# Patient Record
Sex: Female | Born: 1935 | Race: Black or African American | Hispanic: No
Health system: Southern US, Community
[De-identification: ages and names within clinical notes are randomized; demographics above are authoritative.]

## PROBLEM LIST (undated history)

## (undated) DIAGNOSIS — K219 Gastro-esophageal reflux disease without esophagitis: Secondary | ICD-10-CM

## (undated) DIAGNOSIS — F419 Anxiety disorder, unspecified: Secondary | ICD-10-CM

## (undated) DIAGNOSIS — F0390 Unspecified dementia without behavioral disturbance: Secondary | ICD-10-CM

## (undated) DIAGNOSIS — M1990 Unspecified osteoarthritis, unspecified site: Secondary | ICD-10-CM

## (undated) DIAGNOSIS — H409 Unspecified glaucoma: Secondary | ICD-10-CM

## (undated) DIAGNOSIS — F0280 Dementia in other diseases classified elsewhere without behavioral disturbance: Secondary | ICD-10-CM

## (undated) DIAGNOSIS — G309 Alzheimer's disease, unspecified: Secondary | ICD-10-CM

## (undated) DIAGNOSIS — K5900 Constipation, unspecified: Secondary | ICD-10-CM

## (undated) DIAGNOSIS — E7800 Pure hypercholesterolemia, unspecified: Secondary | ICD-10-CM

## (undated) DIAGNOSIS — J189 Pneumonia, unspecified organism: Secondary | ICD-10-CM

## (undated) DIAGNOSIS — I10 Essential (primary) hypertension: Secondary | ICD-10-CM

## (undated) DIAGNOSIS — E559 Vitamin D deficiency, unspecified: Secondary | ICD-10-CM

---

## 1998-02-03 ENCOUNTER — Ambulatory Visit (HOSPITAL_COMMUNITY): Admission: RE | Admit: 1998-02-03 | Discharge: 1998-02-03 | Payer: Self-pay | Admitting: *Deleted

## 1998-11-17 ENCOUNTER — Ambulatory Visit (HOSPITAL_COMMUNITY): Admission: RE | Admit: 1998-11-17 | Discharge: 1998-11-17 | Payer: Self-pay | Admitting: *Deleted

## 1999-03-28 ENCOUNTER — Encounter: Payer: Self-pay | Admitting: *Deleted

## 1999-03-28 ENCOUNTER — Ambulatory Visit (HOSPITAL_COMMUNITY): Admission: RE | Admit: 1999-03-28 | Discharge: 1999-03-28 | Payer: Self-pay | Admitting: *Deleted

## 2002-04-22 ENCOUNTER — Ambulatory Visit (HOSPITAL_COMMUNITY): Admission: RE | Admit: 2002-04-22 | Discharge: 2002-04-22 | Payer: Self-pay | Admitting: *Deleted

## 2002-04-22 ENCOUNTER — Encounter: Payer: Self-pay | Admitting: *Deleted

## 2004-03-27 ENCOUNTER — Ambulatory Visit (HOSPITAL_COMMUNITY): Admission: RE | Admit: 2004-03-27 | Discharge: 2004-03-27 | Payer: Self-pay | Admitting: *Deleted

## 2004-05-05 ENCOUNTER — Encounter: Admission: RE | Admit: 2004-05-05 | Discharge: 2004-05-05 | Payer: Self-pay | Admitting: *Deleted

## 2004-10-22 ENCOUNTER — Emergency Department (HOSPITAL_COMMUNITY): Admission: EM | Admit: 2004-10-22 | Discharge: 2004-10-22 | Payer: Self-pay | Admitting: Emergency Medicine

## 2004-10-27 ENCOUNTER — Emergency Department (HOSPITAL_COMMUNITY): Admission: EM | Admit: 2004-10-27 | Discharge: 2004-10-27 | Payer: Self-pay | Admitting: Emergency Medicine

## 2006-01-31 ENCOUNTER — Ambulatory Visit: Payer: Self-pay | Admitting: Hematology & Oncology

## 2006-08-18 ENCOUNTER — Emergency Department (HOSPITAL_COMMUNITY): Admission: EM | Admit: 2006-08-18 | Discharge: 2006-08-18 | Payer: Self-pay | Admitting: Family Medicine

## 2007-03-17 ENCOUNTER — Ambulatory Visit: Payer: Self-pay | Admitting: Hematology & Oncology

## 2008-09-10 ENCOUNTER — Ambulatory Visit: Payer: Self-pay | Admitting: Hematology and Oncology

## 2009-04-19 ENCOUNTER — Emergency Department (HOSPITAL_COMMUNITY): Admission: EM | Admit: 2009-04-19 | Discharge: 2009-04-19 | Payer: Self-pay | Admitting: Emergency Medicine

## 2010-05-04 ENCOUNTER — Inpatient Hospital Stay (HOSPITAL_COMMUNITY): Admission: EM | Admit: 2010-05-04 | Discharge: 2010-05-11 | Payer: Self-pay | Admitting: Emergency Medicine

## 2010-05-11 ENCOUNTER — Ambulatory Visit: Payer: Self-pay | Admitting: Psychiatry

## 2010-05-26 ENCOUNTER — Ambulatory Visit: Payer: Self-pay | Admitting: Psychiatry

## 2010-07-04 ENCOUNTER — Ambulatory Visit: Payer: Self-pay | Admitting: Psychiatry

## 2011-01-01 ENCOUNTER — Encounter: Payer: Self-pay | Admitting: Internal Medicine

## 2011-02-16 ENCOUNTER — Emergency Department (HOSPITAL_BASED_OUTPATIENT_CLINIC_OR_DEPARTMENT_OTHER)
Admission: EM | Admit: 2011-02-16 | Discharge: 2011-02-16 | Disposition: A | Discharge status: Home / Self Care | Payer: Medicare Other | Attending: Emergency Medicine | Admitting: Emergency Medicine

## 2011-02-16 DIAGNOSIS — I2510 Atherosclerotic heart disease of native coronary artery without angina pectoris: Secondary | ICD-10-CM | POA: Insufficient documentation

## 2011-02-16 DIAGNOSIS — Z79899 Other long term (current) drug therapy: Secondary | ICD-10-CM | POA: Insufficient documentation

## 2011-02-16 DIAGNOSIS — I10 Essential (primary) hypertension: Secondary | ICD-10-CM | POA: Insufficient documentation

## 2011-02-16 DIAGNOSIS — N390 Urinary tract infection, site not specified: Secondary | ICD-10-CM | POA: Insufficient documentation

## 2011-02-16 DIAGNOSIS — Z8739 Personal history of other diseases of the musculoskeletal system and connective tissue: Secondary | ICD-10-CM | POA: Insufficient documentation

## 2011-02-16 DIAGNOSIS — R350 Frequency of micturition: Secondary | ICD-10-CM | POA: Insufficient documentation

## 2011-02-16 LAB — URINALYSIS, ROUTINE W REFLEX MICROSCOPIC
Ketones, ur: 15 mg/dL — AB
Nitrite: POSITIVE — AB
Protein, ur: 100 mg/dL — AB
Specific Gravity, Urine: 1.018 (ref 1.005–1.030)
Urobilinogen, UA: 1 mg/dL (ref 0.0–1.0)

## 2011-02-16 LAB — URINE MICROSCOPIC-ADD ON

## 2011-02-19 LAB — URINE CULTURE
Colony Count: >=100000
Culture  Setup Time: 201203100143

## 2011-02-26 LAB — BASIC METABOLIC PANEL
BUN: 26 mg/dL — ABNORMAL HIGH (ref 6–23)
CO2: 26 mEq/L (ref 19–32)
Chloride: 107 mEq/L (ref 96–112)
Creatinine, Ser: 0.72 mg/dL (ref 0.4–1.2)
Potassium: 3.7 mEq/L (ref 3.5–5.1)

## 2011-02-26 LAB — DIFFERENTIAL
Basophils Absolute: 0 10*3/uL (ref 0.0–0.1)
Basophils Absolute: 0 10*3/uL (ref 0.0–0.1)
Eosinophils Relative: 1 % (ref 0–5)
Eosinophils Relative: 1 % (ref 0–5)
Lymphocytes Relative: 35 % (ref 12–46)
Lymphs Abs: 1.9 10*3/uL (ref 0.7–4.0)
Monocytes Absolute: 0.6 10*3/uL (ref 0.1–1.0)
Monocytes Relative: 12 % (ref 3–12)
Monocytes Relative: 17 % — ABNORMAL HIGH (ref 3–12)
Neutro Abs: 2 10*3/uL (ref 1.7–7.7)
Neutro Abs: 2.3 10*3/uL (ref 1.7–7.7)

## 2011-02-26 LAB — CARDIAC PANEL(CRET KIN+CKTOT+MB+TROPI)
CK, MB: 2.4 ng/mL (ref 0.3–4.0)
CK, MB: 4.2 ng/mL — ABNORMAL HIGH (ref 0.3–4.0)
Relative Index: 2.8 — ABNORMAL HIGH (ref 0.0–2.5)
Total CK: 121 U/L (ref 7–177)
Total CK: 151 U/L (ref 7–177)
Troponin I: 0.03 ng/mL (ref 0.00–0.06)

## 2011-02-26 LAB — URINE CULTURE: Colony Count: >=100000

## 2011-02-26 LAB — CBC
HCT: 27.8 % — ABNORMAL LOW (ref 36.0–46.0)
MCHC: 31.1 g/dL (ref 30.0–36.0)
MCHC: 31.9 g/dL (ref 30.0–36.0)
MCV: 79.5 fL (ref 78.0–100.0)
MCV: 80.2 fL (ref 78.0–100.0)
Platelets: 459 10*3/uL — ABNORMAL HIGH (ref 150–400)
Platelets: 519 10*3/uL — ABNORMAL HIGH (ref 150–400)
RDW: 21.9 % — ABNORMAL HIGH (ref 11.5–15.5)
WBC: 4.9 10*3/uL (ref 4.0–10.5)

## 2011-02-26 LAB — URINE MICROSCOPIC-ADD ON

## 2011-02-26 LAB — COMPREHENSIVE METABOLIC PANEL
AST: 20 U/L (ref 0–37)
Albumin: 3 g/dL — ABNORMAL LOW (ref 3.5–5.2)
CO2: 26 mEq/L (ref 19–32)
Calcium: 9 mg/dL (ref 8.4–10.5)
Creatinine, Ser: 0.67 mg/dL (ref 0.4–1.2)
GFR calc Af Amer: >60 mL/min (ref >60)
GFR calc non Af Amer: >60 mL/min (ref >60)
Sodium: 139 mEq/L (ref 135–145)
Total Protein: 7.4 g/dL (ref 6.0–8.3)

## 2011-02-26 LAB — PROTIME-INR: Prothrombin Time: 13.5 seconds (ref 11.6–15.2)

## 2011-02-26 LAB — IRON AND TIBC
Saturation Ratios: 10 % — ABNORMAL LOW (ref 20–55)
UIBC: 281 ug/dL

## 2011-02-26 LAB — RAPID URINE DRUG SCREEN, HOSP PERFORMED
Amphetamines: NOT DETECTED
Tetrahydrocannabinol: NOT DETECTED

## 2011-02-26 LAB — TRICYCLICS SCREEN, URINE: TCA Scrn: NOT DETECTED

## 2011-02-26 LAB — VITAMIN B12: Vitamin B-12: 325 pg/mL (ref 211–911)

## 2011-02-26 LAB — HEMOCCULT GUIAC POC 1CARD (OFFICE): Fecal Occult Bld: NEGATIVE

## 2011-02-26 LAB — URINALYSIS, ROUTINE W REFLEX MICROSCOPIC
Bilirubin Urine: NEGATIVE
Glucose, UA: NEGATIVE mg/dL
Ketones, ur: NEGATIVE mg/dL
Protein, ur: NEGATIVE mg/dL
pH: 7 (ref 5.0–8.0)

## 2011-02-26 LAB — FERRITIN: Ferritin: 57 ng/mL (ref 10–291)

## 2011-02-26 LAB — RPR: RPR Ser Ql: NONREACTIVE

## 2011-03-20 LAB — DIFFERENTIAL
Basophils Absolute: 0 10*3/uL (ref 0.0–0.1)
Basophils Relative: 0 % (ref 0–1)
Lymphocytes Relative: 20 % (ref 12–46)
Monocytes Relative: 13 % — ABNORMAL HIGH (ref 3–12)
Neutro Abs: 3.7 10*3/uL (ref 1.7–7.7)
Neutrophils Relative %: 66 % (ref 43–77)

## 2011-03-20 LAB — CBC
MCHC: 32.7 g/dL (ref 30.0–36.0)
RBC: 2.54 MIL/uL — ABNORMAL LOW (ref 3.87–5.11)

## 2011-03-20 LAB — HEMOCCULT GUIAC POC 1CARD (OFFICE): Fecal Occult Bld: POSITIVE

## 2013-06-07 ENCOUNTER — Emergency Department (HOSPITAL_BASED_OUTPATIENT_CLINIC_OR_DEPARTMENT_OTHER)
Admission: EM | Admit: 2013-06-07 | Discharge: 2013-06-08 | Disposition: A | Discharge status: Skilled Nursing Facility | Payer: Medicare Other | Attending: Emergency Medicine | Admitting: Emergency Medicine

## 2013-06-07 ENCOUNTER — Emergency Department (HOSPITAL_BASED_OUTPATIENT_CLINIC_OR_DEPARTMENT_OTHER): Payer: Medicare Other

## 2013-06-07 ENCOUNTER — Encounter (HOSPITAL_BASED_OUTPATIENT_CLINIC_OR_DEPARTMENT_OTHER): Payer: Self-pay | Admitting: *Deleted

## 2013-06-07 DIAGNOSIS — F0390 Unspecified dementia without behavioral disturbance: Secondary | ICD-10-CM | POA: Insufficient documentation

## 2013-06-07 DIAGNOSIS — K8689 Other specified diseases of pancreas: Secondary | ICD-10-CM | POA: Insufficient documentation

## 2013-06-07 DIAGNOSIS — Z79899 Other long term (current) drug therapy: Secondary | ICD-10-CM | POA: Insufficient documentation

## 2013-06-07 DIAGNOSIS — K869 Disease of pancreas, unspecified: Secondary | ICD-10-CM

## 2013-06-07 DIAGNOSIS — K219 Gastro-esophageal reflux disease without esophagitis: Secondary | ICD-10-CM | POA: Insufficient documentation

## 2013-06-07 DIAGNOSIS — R1110 Vomiting, unspecified: Secondary | ICD-10-CM

## 2013-06-07 DIAGNOSIS — K8020 Calculus of gallbladder without cholecystitis without obstruction: Secondary | ICD-10-CM | POA: Insufficient documentation

## 2013-06-07 DIAGNOSIS — N289 Disorder of kidney and ureter, unspecified: Secondary | ICD-10-CM | POA: Insufficient documentation

## 2013-06-07 DIAGNOSIS — E7800 Pure hypercholesterolemia, unspecified: Secondary | ICD-10-CM | POA: Insufficient documentation

## 2013-06-07 HISTORY — DX: Unspecified dementia, unspecified severity, without behavioral disturbance, psychotic disturbance, mood disturbance, and anxiety: F03.90

## 2013-06-07 HISTORY — DX: Pure hypercholesterolemia, unspecified: E78.00

## 2013-06-07 HISTORY — DX: Gastro-esophageal reflux disease without esophagitis: K21.9

## 2013-06-07 LAB — COMPREHENSIVE METABOLIC PANEL
ALT: 11 U/L (ref 0–35)
AST: 19 U/L (ref 0–37)
CO2: 25 mEq/L (ref 19–32)
Chloride: 98 mEq/L (ref 96–112)
GFR calc non Af Amer: 54 mL/min — ABNORMAL LOW (ref >90)
Potassium: 4 mEq/L (ref 3.5–5.1)
Sodium: 132 mEq/L — ABNORMAL LOW (ref 135–145)
Total Bilirubin: 0.2 mg/dL — ABNORMAL LOW (ref 0.3–1.2)

## 2013-06-07 LAB — CBC WITH DIFFERENTIAL/PLATELET
Basophils Absolute: 0 10*3/uL (ref 0.0–0.1)
HCT: 29 % — ABNORMAL LOW (ref 36.0–46.0)
Lymphocytes Relative: 19 % (ref 12–46)
Neutro Abs: 4.4 10*3/uL (ref 1.7–7.7)
Platelets: 416 10*3/uL — ABNORMAL HIGH (ref 150–400)
RDW: 19 % — ABNORMAL HIGH (ref 11.5–15.5)
WBC: 5.9 10*3/uL (ref 4.0–10.5)

## 2013-06-07 LAB — URINALYSIS, ROUTINE W REFLEX MICROSCOPIC
Glucose, UA: NEGATIVE mg/dL
Hgb urine dipstick: NEGATIVE
Ketones, ur: NEGATIVE mg/dL
Protein, ur: NEGATIVE mg/dL

## 2013-06-07 MED ORDER — IOHEXOL 300 MG/ML  SOLN: 50.0000 mL | Freq: Once | INTRAMUSCULAR | Status: AC | PRN

## 2013-06-07 MED ORDER — IOHEXOL 300 MG/ML  SOLN
100.0000 mL | Freq: Once | INTRAMUSCULAR | Status: AC | PRN
  Administered 2013-06-07: 100 mL via INTRAVENOUS

## 2013-06-07 NOTE — ED Notes (Signed)
I assisted with socks, then helped patient on to a bedside toilet. After helping her back into bed, I placed new diaper on her, then re-covered her made comfortable. I then took urine sample to lab.

## 2013-06-07 NOTE — ED Notes (Signed)
Per EMS patient had sudden onset of vomiting, no meds given. Per EMS glucose -  122, no c/o tenderness

## 2013-06-07 NOTE — ED Provider Notes (Signed)
History    CSN: [id number] Arrival date & time 06/07/13  1826  First MD Initiated Contact with Patient 06/07/13 1903     Chief Complaint  Patient presents with  . Emesis   (Consider location/radiation/quality/duration/timing/severity/associated sxs/prior Treatment) HPI Karla Morgan is a 79 y.o. female who presents to ED with from nursing home with complaint of vomiting x1. Per nursing home, pt was brought in to dining room to eat when she vomited x1. Pt denies any pain or any complaints. Pt does have advance dementia and not a very good  Historian. No other history provided Past Medical History  Diagnosis Date  . Dementia   . Acid reflux   . High cholesterol    History reviewed. No pertinent past surgical history. No family history on file. History  Substance Use Topics  . Smoking status: Not on file  . Smokeless tobacco: Not on file  . Alcohol Use: No   OB History   Grav Para Term Preterm Abortions TAB SAB Ect Mult Living                 Review of Systems  Unable to perform ROS: Dementia  Constitutional: Negative for fever.  Gastrointestinal: Positive for vomiting.    Allergies  Review of patient's allergies indicates no known allergies.  Home Medications   Current Outpatient Rx  Name  Route  Sig  Dispense  Refill  . acetaminophen (TYLENOL) 325 MG tablet   Oral   Take 650 mg by mouth every 6 (six) hours as needed for pain.         . cholecalciferol (VITAMIN D) 1000 UNITS tablet   Oral   Take 1,000 Units by mouth daily.         . ferrous sulfate 325 (65 FE) MG tablet   Oral   Take 325 mg by mouth daily with breakfast.         . fluticasone (FLONASE) 50 MCG/ACT nasal spray   Nasal   Place 2 sprays into the nose 2 (two) times daily.         Marland Kitchen omeprazole (PRILOSEC) 20 MG capsule   Oral   Take 20 mg by mouth daily.         . polyethylene glycol (MIRALAX / GLYCOLAX) packet   Oral   Take 17 g by mouth daily as needed.         .  simvastatin (ZOCOR) 5 MG tablet   Oral   Take 5 mg by mouth at bedtime.         . Travoprost, BAK Free, (TRAVATAN) 0.004 % SOLN ophthalmic solution   Both Eyes   Place 1 drop into both eyes at bedtime.          BP 172/69  Pulse 76  Temp(Src) 97.5 F (36.4 C) (Oral)  Resp 19  SpO2 100% Physical Exam  Nursing note and vitals reviewed. Constitutional: She appears well-developed and well-nourished. No distress.  Cardiovascular: Normal rate, regular rhythm and normal heart sounds.   Pulmonary/Chest: Effort normal and breath sounds normal. No respiratory distress. She has no wheezes. She has no rales.  Abdominal: Soft. Bowel sounds are normal. She exhibits no distension. There is tenderness. There is no rebound and no guarding.  Diffuse tenderness worse in LLQ  Musculoskeletal: She exhibits no edema.  Neurological: She is alert.  Pt disoriented  Skin: Skin is warm and dry.    ED Course  Procedures (including critical care time) Labs Reviewed  CBC WITH DIFFERENTIAL - Abnormal; Notable for the following:    RBC 3.47 (*)    Hemoglobin 9.1 (*)    HCT 29.0 (*)    RDW 19.0 (*)    Platelets 416 (*)    All other components within normal limits  COMPREHENSIVE METABOLIC PANEL - Abnormal; Notable for the following:    Sodium 132 (*)    Glucose, Bld 115 (*)    Total Protein 10.3 (*)    Albumin 3.4 (*)    Total Bilirubin 0.2 (*)    GFR calc non Af Amer 54 (*)    GFR calc Af Amer 63 (*)    All other components within normal limits  LIPASE, BLOOD  URINALYSIS, ROUTINE W REFLEX MICROSCOPIC   Ct Abdomen Pelvis W Contrast  06/08/2013   *RADIOLOGY REPORT*  Clinical Data: Vomiting.  Dementia.  CT ABDOMEN AND PELVIS WITH CONTRAST  Technique:  Multidetector CT imaging of the abdomen and pelvis was performed following the standard protocol during bolus administration of intravenous contrast.  Contrast: OMNIPAQUE IOHEXOL 300 MG/ML  SOLN  Comparison: Chest and two views abdomen this same  date.  Findings: There is cardiomegaly.  No pleural or pericardial effusion.  Mild basilar atelectasis is identified.  Small stones are seen layering dependently within the gallbladder. There is no CT evidence of acute cholecystitis.  The liver, spleen, adrenal glands and biliary tree are unremarkable.  There is a 1.1 x 1.2 cm lesion in the right kidney which demonstrates increased attenuation and cannot be definitively characterized on this study. A simple left renal cyst is identified.  Low attenuating lesion in the body of the pancreas measuring 1.2 by 1.1 cm is identified. There is no pancreatic atrophy or ductal dilatation.  The patient is status post hysterectomy.  Colonic diverticulosis without diverticulitis is identified.  The stomach and small bowel are unremarkable.  No lytic or sclerotic bony lesion is seen with lumbar scoliosis and multilevel spondylosis identified.  The patient has mild superior endplate compression fractures of T12 and L1 which appear remote but cannot be definitively characterized.  No focal lytic or sclerotic bony lesion is identified.  IMPRESSION:  1.  No acute abnormality.  2.  Small gallstones without evidence of cholecystitis.  3.  Diverticulosis without diverticulitis.  4.  Nonspecific lesion in the right kidney cannot be definitively characterized.  Recommend repeat CT scan in 6-9 months indicated to exclude renal cell neoplasm.  5.  1.2 cm low attenuating pancreatic lesion is likely due to prior inflammatory process.  6.  Mild superior endplate compression fractures of T12 and L1 cannot be definitively characterized but appear remote.   Original Report Authenticated By: Holley Dexter, M.D.   Dg Abd Acute W/chest  06/07/2013   *RADIOLOGY REPORT*  Clinical Data:  Sudden onset of vomiting  ACUTE ABDOMEN SERIES (ABDOMEN 2 VIEW & CHEST 1 VIEW)  Comparison: Prior radiograph from 05/04/10  Findings: The cardiac and mediastinal silhouettes are stable in size and contour.  Mild  tortuosity of the thoracic aorta is noted. Calcified atherosclerotic disease is present within the aortic arch.  The current examination has been performed with a slightly shallow degree of lung inflation.  There is no airspace consolidation, pleural effusion, or pulmonary edema.  Mild linear opacities present at the bases most likely reflect atelectasis.  There is no pneumothorax.  In the abdomen, no dilated loops of bowel to suggest obstruction or ileus are identified.  There is no free intraperitoneal air.  No  abnormal soft tissue masses or calcifications are identified. Multiple calcified phleboliths are noted.  Scoliosis of the thoracolumbar spine is again noted, unchanged. There is diffuse osteopenia.  No acute osseous abnormalities.  IMPRESSION: 1.  Stable appearance of the chest with no acute cardiopulmonary process. Mild bibasilar atelectasis.  2.  Nonobstructive bowel gas pattern.   Original Report Authenticated By: Rise Mu, M.D.   1. Vomiting   2. Cholelithiasis   3. Pancreatic lesion   4. Kidney lesion, native, right     MDM  Pt with episode of vomiting  Prior to dinner today. On examination pt did have abdominal tenderness. No guarding noted. Bowel sounds normal. Labs and UA unremarkable. Pt refusing PO challenge, un cooperative. Unable to further assess. CT abd/pelvis ordered. CT unremarkable. Pt denies headache, no cp, no shortness of breath. Hypertensive, otherwise normal VS. Pt not vomiting in ED. Plan to d/c home with close follow up with PCP.   Filed Vitals:   06/07/13 1830  BP: 172/69  Pulse: 76  Temp: 97.5 F (36.4 C)  TempSrc: Oral  Resp: 19  SpO2: 100%     Myriam Jacobson Raydon Chappuis, PA-C 06/08/13 0024

## 2013-06-08 NOTE — ED Notes (Signed)
[facility] called to transport back to [facility]

## 2013-06-08 NOTE — ED Notes (Signed)
Two attempts to call [facility] without answer from staff to give report on returning patient.

## 2013-06-09 NOTE — ED Provider Notes (Signed)
Medical screening examination/treatment/procedure(s) were conducted as a shared visit with non-physician practitioner(s) and myself.  I personally evaluated the patient during the encounter   Rolan Bucco, MD 06/09/13 1458

## 2014-07-03 ENCOUNTER — Encounter (HOSPITAL_BASED_OUTPATIENT_CLINIC_OR_DEPARTMENT_OTHER): Payer: Self-pay | Admitting: Emergency Medicine

## 2014-07-03 ENCOUNTER — Emergency Department (HOSPITAL_BASED_OUTPATIENT_CLINIC_OR_DEPARTMENT_OTHER): Payer: Medicare Other

## 2014-07-03 ENCOUNTER — Emergency Department (HOSPITAL_BASED_OUTPATIENT_CLINIC_OR_DEPARTMENT_OTHER)
Admission: EM | Admit: 2014-07-03 | Discharge: 2014-07-03 | Disposition: A | Discharge status: Skilled Nursing Facility | Payer: Medicare Other | Attending: Emergency Medicine | Admitting: Emergency Medicine

## 2014-07-03 DIAGNOSIS — H409 Unspecified glaucoma: Secondary | ICD-10-CM | POA: Diagnosis not present

## 2014-07-03 DIAGNOSIS — Z79899 Other long term (current) drug therapy: Secondary | ICD-10-CM | POA: Insufficient documentation

## 2014-07-03 DIAGNOSIS — E7800 Pure hypercholesterolemia, unspecified: Secondary | ICD-10-CM | POA: Insufficient documentation

## 2014-07-03 DIAGNOSIS — Z8701 Personal history of pneumonia (recurrent): Secondary | ICD-10-CM | POA: Diagnosis not present

## 2014-07-03 DIAGNOSIS — M129 Arthropathy, unspecified: Secondary | ICD-10-CM | POA: Diagnosis not present

## 2014-07-03 DIAGNOSIS — I10 Essential (primary) hypertension: Secondary | ICD-10-CM | POA: Diagnosis not present

## 2014-07-03 DIAGNOSIS — K219 Gastro-esophageal reflux disease without esophagitis: Secondary | ICD-10-CM | POA: Diagnosis not present

## 2014-07-03 DIAGNOSIS — F0280 Dementia in other diseases classified elsewhere without behavioral disturbance: Secondary | ICD-10-CM | POA: Insufficient documentation

## 2014-07-03 DIAGNOSIS — Z8659 Personal history of other mental and behavioral disorders: Secondary | ICD-10-CM | POA: Insufficient documentation

## 2014-07-03 DIAGNOSIS — G309 Alzheimer's disease, unspecified: Secondary | ICD-10-CM | POA: Insufficient documentation

## 2014-07-03 DIAGNOSIS — IMO0002 Reserved for concepts with insufficient information to code with codable children: Secondary | ICD-10-CM | POA: Diagnosis not present

## 2014-07-03 DIAGNOSIS — R109 Unspecified abdominal pain: Secondary | ICD-10-CM | POA: Insufficient documentation

## 2014-07-03 HISTORY — DX: Unspecified glaucoma: H40.9

## 2014-07-03 HISTORY — DX: Anxiety disorder, unspecified: F41.9

## 2014-07-03 HISTORY — DX: Vitamin D deficiency, unspecified: E55.9

## 2014-07-03 HISTORY — DX: Dementia in other diseases classified elsewhere, unspecified severity, without behavioral disturbance, psychotic disturbance, mood disturbance, and anxiety: F02.80

## 2014-07-03 HISTORY — DX: Essential (primary) hypertension: I10

## 2014-07-03 HISTORY — DX: Constipation, unspecified: K59.00

## 2014-07-03 HISTORY — DX: Alzheimer's disease, unspecified: G30.9

## 2014-07-03 HISTORY — DX: Pneumonia, unspecified organism: J18.9

## 2014-07-03 HISTORY — DX: Unspecified osteoarthritis, unspecified site: M19.90

## 2014-07-03 LAB — COMPREHENSIVE METABOLIC PANEL
ALK PHOS: 70 U/L (ref 39–117)
ALT: 13 U/L (ref 0–35)
ANION GAP: 8 (ref 5–15)
AST: 24 U/L (ref 0–37)
Albumin: 3.2 g/dL — ABNORMAL LOW (ref 3.5–5.2)
BILIRUBIN TOTAL: 0.4 mg/dL (ref 0.3–1.2)
BUN: 20 mg/dL (ref 6–23)
CHLORIDE: 102 meq/L (ref 96–112)
CO2: 25 mEq/L (ref 19–32)
CREATININE: 0.9 mg/dL (ref 0.50–1.10)
Calcium: 10.1 mg/dL (ref 8.4–10.5)
GFR calc non Af Amer: 54 mL/min — ABNORMAL LOW (ref >90)
GFR, EST AFRICAN AMERICAN: 62 mL/min — AB (ref >90)
Glucose, Bld: 91 mg/dL (ref 70–99)
Potassium: 4.6 mEq/L (ref 3.7–5.3)
Sodium: 135 mEq/L — ABNORMAL LOW (ref 137–147)
TOTAL PROTEIN: 11.1 g/dL — AB (ref 6.0–8.3)

## 2014-07-03 LAB — URINALYSIS, ROUTINE W REFLEX MICROSCOPIC
Bilirubin Urine: NEGATIVE
Glucose, UA: NEGATIVE mg/dL
Hgb urine dipstick: NEGATIVE
Ketones, ur: NEGATIVE mg/dL
Leukocytes, UA: NEGATIVE
Nitrite: NEGATIVE
Protein, ur: 30 mg/dL — AB
Specific Gravity, Urine: 1.019 (ref 1.005–1.030)
Urobilinogen, UA: 0.2 mg/dL (ref 0.0–1.0)
pH: 8 (ref 5.0–8.0)

## 2014-07-03 LAB — CBC WITH DIFFERENTIAL/PLATELET
Basophils Absolute: 0 K/uL (ref 0.0–0.1)
Basophils Relative: 0 % (ref 0–1)
Eosinophils Absolute: 0 K/uL (ref 0.0–0.7)
Eosinophils Relative: 0 % (ref 0–5)
HCT: 25 % — ABNORMAL LOW (ref 36.0–46.0)
Hemoglobin: 7.6 g/dL — ABNORMAL LOW (ref 12.0–15.0)
Lymphocytes Relative: 37 % (ref 12–46)
Lymphs Abs: 1.5 K/uL (ref 0.7–4.0)
MCH: 26.5 pg (ref 26.0–34.0)
MCHC: 30.4 g/dL (ref 30.0–36.0)
MCV: 87.1 fL (ref 78.0–100.0)
Monocytes Absolute: 0.3 K/uL (ref 0.1–1.0)
Monocytes Relative: 8 % (ref 3–12)
Neutro Abs: 2.1 K/uL (ref 1.7–7.7)
Neutrophils Relative %: 54 % (ref 43–77)
Platelets: 388 K/uL (ref 150–400)
RBC: 2.87 MIL/uL — ABNORMAL LOW (ref 3.87–5.11)
RDW: 19.4 % — ABNORMAL HIGH (ref 11.5–15.5)
WBC: 3.9 K/uL — ABNORMAL LOW (ref 4.0–10.5)

## 2014-07-03 LAB — URINE MICROSCOPIC-ADD ON

## 2014-07-03 NOTE — ED Provider Notes (Signed)
CSN: [id number]     Arrival date & time 07/03/14  1727 History  This chart was scribed for Geoffery Lyonsouglas Florenda Watt, MD by Ardelia Memsylan Malpass, ED Scribe. This patient was seen in room MH01/MH01 and the patient's care was started at 5:43 PM.   Chief Complaint  Patient presents with  . Flank Pain    The history is provided by the patient. No language interpreter was used.    HPI Comments: Karla Morgan is a 80 y.o. female with a history of dementia who was transported from [facility] facility to the Emergency Department  complaining of intermittent generalized pain in her left side for the past week. She indicates that the pain is present in her left flank and over her left lateral rib area. Staff at [facility] were concerned that pt may be constipated, and she has a history of the same. She denies any difficulty urinating or producing stools. She states that her last BM was yesterday. Pt denies any recent falls or injuries.    Past Medical History  Diagnosis Date  . Dementia   . Acid reflux   . High cholesterol   . Hypertension   . Arthritis   . Anxiety   . Glaucoma   . Constipation   . Vitamin D deficiency   . CAP (community acquired pneumonia)   . Alzheimer's dementia    No past surgical history on file. No family history on file. History  Substance Use Topics  . Smoking status: Not on file  . Smokeless tobacco: Not on file  . Alcohol Use: No   OB History   Grav Para Term Preterm Abortions TAB SAB Ect Mult Living                 Review of Systems A complete 10 system review of systems was obtained and all systems are negative except as noted in the HPI and PMH.   Allergies  Review of patient's allergies indicates no known allergies.  Home Medications   Prior to Admission medications   Medication Sig Start Date End Date Taking? Authorizing Provider  acetaminophen (TYLENOL) 325 MG tablet Take 650 mg by mouth every 6 (six) hours as needed for pain.    Historical Provider, MD   cholecalciferol (VITAMIN D) 1000 UNITS tablet Take 1,000 Units by mouth daily.    Historical Provider, MD  ferrous sulfate 325 (65 FE) MG tablet Take 325 mg by mouth daily with breakfast.    Historical Provider, MD  fluticasone (FLONASE) 50 MCG/ACT nasal spray Place 2 sprays into the nose 2 (two) times daily.    Historical Provider, MD  omeprazole (PRILOSEC) 20 MG capsule Take 20 mg by mouth daily.    Historical Provider, MD  polyethylene glycol (MIRALAX / GLYCOLAX) packet Take 17 g by mouth daily as needed.    Historical Provider, MD  simvastatin (ZOCOR) 5 MG tablet Take 5 mg by mouth at bedtime.    Historical Provider, MD  Travoprost, BAK Free, (TRAVATAN) 0.004 % SOLN ophthalmic solution Place 1 drop into both eyes at bedtime.    Historical Provider, MD   BP 134/75  Pulse 91  Temp(Src) 98.4 F (36.9 C) (Oral)  Resp 20  Physical Exam  Nursing note and vitals reviewed. Constitutional: She is oriented to person, place, and time. She appears well-developed and well-nourished. No distress.  HENT:  Head: Normocephalic and atraumatic.  Eyes: Conjunctivae and EOM are normal.  Neck: Neck supple. No tracheal deviation present.  Cardiovascular: Normal rate.  Pulmonary/Chest: Effort normal. No respiratory distress.  Abdominal: Soft. Bowel sounds are normal. She exhibits no distension. There is no tenderness.  There is no CVA tenderness  Musculoskeletal: Normal range of motion.  Neurological: She is alert and oriented to person, place, and time.  Skin: Skin is warm and dry.  Psychiatric: She has a normal mood and affect. Her behavior is normal.    ED Course  Procedures (including critical care time)  DIAGNOSTIC STUDIES:   COORDINATION OF CARE: 5:49 PM- Pt advised of plan for treatment and pt agrees.  Labs Review Labs Reviewed  CBC WITH DIFFERENTIAL  COMPREHENSIVE METABOLIC PANEL  URINALYSIS, ROUTINE W REFLEX MICROSCOPIC    Imaging Review No results found.   EKG  Interpretation None      MDM   Final diagnoses:  None    Patient sent here for evaluation of left flank pain. She is a resident of an extended care facility and was sent here to be evaluated for possible constipation. Patient appears to be somewhat demented, however is able to answer my questions appropriately.She is denying to me she is having any abdominal pain. When asked where her pain is located, she points to her left lateral rib cage. She is tender to palpation over this area, however x-rays do not reveal any fractures. A KUB was obtained which reveals a nonspecific bowel gas pattern, and does not appear to have a fecal impaction or significant quantity of stool or air. Laboratory studies revealed no elevation of white count and there is no fever that would indicate an infectious process. She is anemic with a hemoglobin of 7.8, however upon reviewing her chart this is consistent with her baseline. There is no significant electrolyte abnormality and urinalysis does not reveal an infection. At this point I feel as though she is appropriate for discharge with when necessary followup.   I personally performed the services described in this documentation, which was scribed in my presence. The recorded information has been reviewed and is accurate.     Geoffery Lyons, MD 07/03/14 2003

## 2014-07-03 NOTE — Discharge Instructions (Signed)
Flank Pain °Flank pain refers to pain that is located on the side of the body between the upper abdomen and the back. The pain may occur over a short period of time (acute) or may be long-term or reoccurring (chronic). It may be mild or severe. Flank pain can be caused by many things. °CAUSES  °Some of the more common causes of flank pain include: °· Muscle strains.   °· Muscle spasms.   °· A disease of your spine (vertebral disk disease).   °· A lung infection (pneumonia).   °· Fluid around your lungs (pulmonary edema).   °· A kidney infection.   °· Kidney stones.   °· A very painful skin rash caused by the chickenpox virus (shingles).   °· Gallbladder disease.   °HOME CARE INSTRUCTIONS  °Home care will depend on the cause of your pain. In general, °· Rest as directed by your caregiver. °· Drink enough fluids to keep your urine clear or pale yellow. °· Only take over-the-counter or prescription medicines as directed by your caregiver. Some medicines may help relieve the pain. °· Tell your caregiver about any changes in your pain. °· Follow up with your caregiver as directed. °SEEK IMMEDIATE MEDICAL CARE IF:  °· Your pain is not controlled with medicine.   °· You have new or worsening symptoms. °· Your pain increases.   °· You have abdominal pain.   °· You have shortness of breath.   °· You have persistent nausea or vomiting.   °· You have swelling in your abdomen.   °· You feel faint or pass out.   °· You have blood in your urine. °· You have a fever or persistent symptoms for more than 2-3 days. °· You have a fever and your symptoms suddenly get worse. °MAKE SURE YOU:  °· Understand these instructions. °· Will watch your condition. °· Will get help right away if you are not doing well or get worse. °Document Released: 01/17/2006 Document Revised: 08/20/2012 Document Reviewed: 07/10/2012 °Exit[employer]® Patient Information ©2015 [employer]. This information is not intended to replace advice given to you by your  health care provider. Make sure you discuss any questions you have with your health care provider. ° °

## 2014-07-03 NOTE — ED Notes (Signed)
Per transport, [facility] facility  requested she be transported to ER for Left side pain over the past week. No n/v/d. Facility concerned she may be constipated. Patient states her L side is tender to touch

## 2014-07-28 ENCOUNTER — Emergency Department (HOSPITAL_BASED_OUTPATIENT_CLINIC_OR_DEPARTMENT_OTHER): Payer: Medicare Other

## 2014-07-28 ENCOUNTER — Inpatient Hospital Stay (HOSPITAL_BASED_OUTPATIENT_CLINIC_OR_DEPARTMENT_OTHER)
Admission: EM | Admit: 2014-07-28 | Discharge: 2014-08-06 | DRG: 682 | Disposition: A | Discharge status: Hospice | Payer: Medicare Other | Attending: Internal Medicine | Admitting: Internal Medicine

## 2014-07-28 ENCOUNTER — Encounter (HOSPITAL_BASED_OUTPATIENT_CLINIC_OR_DEPARTMENT_OTHER): Payer: Self-pay | Admitting: Emergency Medicine

## 2014-07-28 DIAGNOSIS — E871 Hypo-osmolality and hyponatremia: Secondary | ICD-10-CM | POA: Diagnosis present

## 2014-07-28 DIAGNOSIS — F0280 Dementia in other diseases classified elsewhere without behavioral disturbance: Secondary | ICD-10-CM | POA: Diagnosis present

## 2014-07-28 DIAGNOSIS — E860 Dehydration: Secondary | ICD-10-CM

## 2014-07-28 DIAGNOSIS — I10 Essential (primary) hypertension: Secondary | ICD-10-CM

## 2014-07-28 DIAGNOSIS — A0472 Enterocolitis due to Clostridium difficile, not specified as recurrent: Secondary | ICD-10-CM | POA: Diagnosis present

## 2014-07-28 DIAGNOSIS — N19 Unspecified kidney failure: Secondary | ICD-10-CM

## 2014-07-28 DIAGNOSIS — E43 Unspecified severe protein-calorie malnutrition: Secondary | ICD-10-CM | POA: Diagnosis present

## 2014-07-28 DIAGNOSIS — K219 Gastro-esophageal reflux disease without esophagitis: Secondary | ICD-10-CM | POA: Diagnosis present

## 2014-07-28 DIAGNOSIS — N179 Acute kidney failure, unspecified: Secondary | ICD-10-CM | POA: Diagnosis present

## 2014-07-28 DIAGNOSIS — D649 Anemia, unspecified: Secondary | ICD-10-CM | POA: Diagnosis present

## 2014-07-28 DIAGNOSIS — I959 Hypotension, unspecified: Secondary | ICD-10-CM | POA: Diagnosis present

## 2014-07-28 DIAGNOSIS — E7800 Pure hypercholesterolemia, unspecified: Secondary | ICD-10-CM | POA: Diagnosis present

## 2014-07-28 DIAGNOSIS — F411 Generalized anxiety disorder: Secondary | ICD-10-CM | POA: Diagnosis present

## 2014-07-28 DIAGNOSIS — R627 Adult failure to thrive: Secondary | ICD-10-CM | POA: Diagnosis present

## 2014-07-28 DIAGNOSIS — M129 Arthropathy, unspecified: Secondary | ICD-10-CM | POA: Diagnosis present

## 2014-07-28 DIAGNOSIS — Z66 Do not resuscitate: Secondary | ICD-10-CM | POA: Diagnosis not present

## 2014-07-28 DIAGNOSIS — E876 Hypokalemia: Secondary | ICD-10-CM | POA: Diagnosis present

## 2014-07-28 DIAGNOSIS — G309 Alzheimer's disease, unspecified: Secondary | ICD-10-CM | POA: Diagnosis present

## 2014-07-28 DIAGNOSIS — Z515 Encounter for palliative care: Secondary | ICD-10-CM | POA: Diagnosis not present

## 2014-07-28 DIAGNOSIS — H409 Unspecified glaucoma: Secondary | ICD-10-CM | POA: Diagnosis present

## 2014-07-28 DIAGNOSIS — E162 Hypoglycemia, unspecified: Secondary | ICD-10-CM | POA: Diagnosis present

## 2014-07-28 LAB — CBC WITH DIFFERENTIAL/PLATELET
BASOS PCT: 0 % (ref 0–1)
Basophils Absolute: 0 10*3/uL (ref 0.0–0.1)
EOS PCT: 1 % (ref 0–5)
Eosinophils Absolute: 0.1 10*3/uL (ref 0.0–0.7)
HEMATOCRIT: 29.3 % — AB (ref 36.0–46.0)
HEMOGLOBIN: 8.5 g/dL — AB (ref 12.0–15.0)
LYMPHS PCT: 42 % (ref 12–46)
Lymphs Abs: 2.1 10*3/uL (ref 0.7–4.0)
MCH: 26 pg (ref 26.0–34.0)
MCHC: 29 g/dL — ABNORMAL LOW (ref 30.0–36.0)
MCV: 89.6 fL (ref 78.0–100.0)
MONO ABS: 0.3 10*3/uL (ref 0.1–1.0)
Monocytes Relative: 6 % (ref 3–12)
NRBC: 2 /100{WBCs} — AB
Neutro Abs: 2.5 10*3/uL (ref 1.7–7.7)
Neutrophils Relative %: 51 % (ref 43–77)
Platelets: 337 10*3/uL (ref 150–400)
RBC: 3.27 MIL/uL — AB (ref 3.87–5.11)
RDW: 21.5 % — ABNORMAL HIGH (ref 11.5–15.5)
WBC: 5 10*3/uL (ref 4.0–10.5)

## 2014-07-28 LAB — COMPREHENSIVE METABOLIC PANEL
ALBUMIN: 3.5 g/dL (ref 3.5–5.2)
ALT: 21 U/L (ref 0–35)
AST: 35 U/L (ref 0–37)
Alkaline Phosphatase: 84 U/L (ref 39–117)
Anion gap: 12 (ref 5–15)
BUN: 96 mg/dL — ABNORMAL HIGH (ref 6–23)
CALCIUM: 10.7 mg/dL — AB (ref 8.4–10.5)
CO2: 25 mEq/L (ref 19–32)
Chloride: 123 mEq/L — ABNORMAL HIGH (ref 96–112)
Creatinine, Ser: 2.1 mg/dL — ABNORMAL HIGH (ref 0.50–1.10)
GFR calc Af Amer: 22 mL/min — ABNORMAL LOW (ref >90)
GFR calc non Af Amer: 19 mL/min — ABNORMAL LOW (ref >90)
Glucose, Bld: 106 mg/dL — ABNORMAL HIGH (ref 70–99)
POTASSIUM: 4 meq/L (ref 3.7–5.3)
SODIUM: 160 meq/L — AB (ref 137–147)
TOTAL PROTEIN: 12.6 g/dL — AB (ref 6.0–8.3)
Total Bilirubin: 0.5 mg/dL (ref 0.3–1.2)

## 2014-07-28 LAB — URINALYSIS, ROUTINE W REFLEX MICROSCOPIC
GLUCOSE, UA: NEGATIVE mg/dL
HGB URINE DIPSTICK: NEGATIVE
Ketones, ur: NEGATIVE mg/dL
LEUKOCYTES UA: NEGATIVE
Nitrite: NEGATIVE
PH: 5 (ref 5.0–8.0)
PROTEIN: 30 mg/dL — AB
Specific Gravity, Urine: 1.026 (ref 1.005–1.030)
Urobilinogen, UA: 0.2 mg/dL (ref 0.0–1.0)

## 2014-07-28 LAB — URINE MICROSCOPIC-ADD ON

## 2014-07-28 MED ORDER — PANTOPRAZOLE SODIUM 40 MG PO TBEC
40.0000 mg | DELAYED_RELEASE_TABLET | Freq: Every day | ORAL | Status: DC
  Administered 2014-08-02: 40 mg via ORAL
  Filled 2014-07-28 (×2): qty 1

## 2014-07-28 MED ORDER — HEPARIN SODIUM (PORCINE) 5000 UNIT/ML IJ SOLN
5000.0000 [IU] | Freq: Three times a day (TID) | INTRAMUSCULAR | Status: DC
Start: 2014-07-28 — End: 2014-08-03
  Administered 2014-07-29 – 2014-08-03 (×8): 5000 [IU] via SUBCUTANEOUS
  Filled 2014-07-28 (×22): qty 1

## 2014-07-28 MED ORDER — LATANOPROST 0.005 % OP SOLN
1.0000 [drp] | Freq: Every day | OPHTHALMIC | Status: DC
  Administered 2014-07-29 – 2014-08-04 (×4): 1 [drp] via OPHTHALMIC
  Filled 2014-07-28: qty 2.5

## 2014-07-28 MED ORDER — DOCUSATE SODIUM 100 MG PO CAPS
100.0000 mg | ORAL_CAPSULE | Freq: Two times a day (BID) | ORAL | Status: DC
  Administered 2014-07-29 – 2014-08-02 (×3): 100 mg via ORAL
  Filled 2014-07-28 (×4): qty 1

## 2014-07-28 MED ORDER — SODIUM CHLORIDE 0.9 % IV SOLN: INTRAVENOUS | Status: DC

## 2014-07-28 MED ORDER — BISACODYL 10 MG RE SUPP: 10.0000 mg | Freq: Every day | RECTAL | Status: DC | PRN

## 2014-07-28 MED ORDER — VITAMIN B-1 100 MG PO TABS
100.0000 mg | ORAL_TABLET | Freq: Every day | ORAL | Status: DC
  Administered 2014-08-02: 100 mg via ORAL
  Filled 2014-07-28 (×6): qty 1

## 2014-07-28 MED ORDER — ACETAMINOPHEN 325 MG PO TABS: 650.0000 mg | ORAL_TABLET | Freq: Four times a day (QID) | ORAL | Status: DC | PRN

## 2014-07-28 MED ORDER — OXYCODONE HCL 5 MG PO TABS
5.0000 mg | ORAL_TABLET | ORAL | Status: DC | PRN
  Administered 2014-07-30 – 2014-07-31 (×2): 5 mg via ORAL
  Filled 2014-07-28 (×2): qty 1

## 2014-07-28 MED ORDER — VITAMIN D3 25 MCG (1000 UNIT) PO TABS
1000.0000 [IU] | ORAL_TABLET | Freq: Every day | ORAL | Status: DC
  Filled 2014-07-28 (×6): qty 1

## 2014-07-28 MED ORDER — SODIUM CHLORIDE 0.9 % IV BOLUS (SEPSIS)
500.0000 mL | Freq: Once | INTRAVENOUS | Status: AC
  Administered 2014-07-28: 500 mL via INTRAVENOUS

## 2014-07-28 MED ORDER — ADULT MULTIVITAMIN W/MINERALS CH
1.0000 | ORAL_TABLET | Freq: Every day | ORAL | Status: DC
  Administered 2014-08-02: 1 via ORAL
  Filled 2014-07-28 (×6): qty 1

## 2014-07-28 MED ORDER — ONDANSETRON HCL 4 MG PO TABS: 4.0000 mg | ORAL_TABLET | Freq: Four times a day (QID) | ORAL | Status: DC | PRN

## 2014-07-28 MED ORDER — SODIUM CHLORIDE 0.9 % IJ SOLN
3.0000 mL | Freq: Two times a day (BID) | INTRAMUSCULAR | Status: DC
  Administered 2014-08-01 – 2014-08-02 (×2): 3 mL via INTRAVENOUS

## 2014-07-28 MED ORDER — SENNA 8.6 MG PO TABS: 1.0000 | ORAL_TABLET | Freq: Every day | ORAL | Status: DC | PRN

## 2014-07-28 MED ORDER — FERROUS SULFATE 325 (65 FE) MG PO TABS
325.0000 mg | ORAL_TABLET | Freq: Every day | ORAL | Status: DC
  Administered 2014-08-02: 325 mg via ORAL
  Filled 2014-07-28 (×6): qty 1

## 2014-07-28 MED ORDER — ONDANSETRON HCL 4 MG/2ML IJ SOLN: 4.0000 mg | Freq: Four times a day (QID) | INTRAMUSCULAR | Status: DC | PRN

## 2014-07-28 MED ORDER — SODIUM CHLORIDE 0.9 % IV SOLN
INTRAVENOUS | Status: DC
  Administered 2014-07-29: via INTRAVENOUS

## 2014-07-28 MED ORDER — ACETAMINOPHEN 650 MG RE SUPP
650.0000 mg | Freq: Four times a day (QID) | RECTAL | Status: DC | PRN
  Filled 2014-07-28: qty 1

## 2014-07-28 MED ORDER — FOLIC ACID 1 MG PO TABS
1.0000 mg | ORAL_TABLET | Freq: Every day | ORAL | Status: DC
  Administered 2014-08-02: 1 mg via ORAL
  Filled 2014-07-28 (×6): qty 1

## 2014-07-28 MED ORDER — SIMVASTATIN 5 MG PO TABS
5.0000 mg | ORAL_TABLET | Freq: Every day | ORAL | Status: DC
  Administered 2014-07-29 (×2): 5 mg via ORAL
  Filled 2014-07-28 (×7): qty 1

## 2014-07-28 MED ORDER — ONDANSETRON 4 MG PO TBDP
4.0000 mg | ORAL_TABLET | ORAL | Status: DC | PRN
  Filled 2014-07-28: qty 1

## 2014-07-28 MED ORDER — FLUTICASONE PROPIONATE 50 MCG/ACT NA SUSP
2.0000 | Freq: Two times a day (BID) | NASAL | Status: DC
  Administered 2014-07-29 – 2014-08-03 (×5): 2 via NASAL
  Filled 2014-07-28: qty 16
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
## 2014-07-28 NOTE — ED Notes (Signed)
Pt with constant motion and scratching self-IVF will not run via IV pump-slowly runs via gravity

## 2014-07-28 NOTE — ED Notes (Signed)
Patient changed into gown.

## 2014-07-28 NOTE — ED Provider Notes (Signed)
CSN: [id number]     Arrival date & time 07/28/14  1515 History   First MD Initiated Contact with Patient 07/28/14 1544     Chief Complaint  Patient presents with  . Failure To Thrive     (Consider location/radiation/quality/duration/timing/severity/associated sxs/prior Treatment) HPI Comments: Pt sent from assisted living facility for several days of refusal to eat or drink, inc difficulty walking. She was started presumptively on cipro today, but UA not obtained.   The history is provided by the patient, the EMS personnel and the nursing home. The history is limited by the condition of the patient. No language interpreter was used.    Past Medical History  Diagnosis Date  . Dementia   . Acid reflux   . High cholesterol   . Hypertension   . Arthritis   . Anxiety   . Glaucoma   . Constipation   . Vitamin D deficiency   . CAP (community acquired pneumonia)   . Alzheimer's dementia    History reviewed. No pertinent past surgical history. No family history on file. History  Substance Use Topics  . Smoking status: Unknown If Ever Smoked  . Smokeless tobacco: Not on file  . Alcohol Use: No   OB History   Grav Para Term Preterm Abortions TAB SAB Ect Mult Living                 Review of Systems  Unable to perform ROS: Dementia      Allergies  Review of patient's allergies indicates no known allergies.  Home Medications   Prior to Admission medications   Medication Sig Start Date End Date Taking? Authorizing Provider  acetaminophen (TYLENOL) 325 MG tablet Take 650 mg by mouth every 6 (six) hours as needed for pain.    Historical Provider, MD  cholecalciferol (VITAMIN D) 1000 UNITS tablet Take 1,000 Units by mouth daily.    Historical Provider, MD  ferrous sulfate 325 (65 FE) MG tablet Take 325 mg by mouth daily with breakfast.    Historical Provider, MD  fluticasone (FLONASE) 50 MCG/ACT nasal spray Place 2 sprays into the nose 2 (two) times daily.    Historical  Provider, MD  guaifenesin (ROBAFEN) 100 MG/5ML syrup Take 200 mg by mouth every 6 (six) hours as needed for cough.    Historical Provider, MD  omeprazole (PRILOSEC) 20 MG capsule Take 20 mg by mouth daily.    Historical Provider, MD  ondansetron (ZOFRAN-ODT) 4 MG disintegrating tablet Take 4 mg by mouth every 4 (four) hours as needed for nausea or vomiting.    Historical Provider, MD  polyethylene glycol (MIRALAX / GLYCOLAX) packet Take 17 g by mouth daily as needed.    Historical Provider, MD  senna (SENOKOT) 8.6 MG TABS tablet Take 1 tablet by mouth.    Historical Provider, MD  simvastatin (ZOCOR) 5 MG tablet Take 5 mg by mouth at bedtime.    Historical Provider, MD  Travoprost, BAK Free, (TRAVATAN) 0.004 % SOLN ophthalmic solution Place 1 drop into both eyes at bedtime.    Historical Provider, MD   BP 129/62  Pulse 111  Temp(Src) 97.4 F (36.3 C) (Axillary)  Resp 19  Wt 83 lb (37.649 kg)  SpO2 94% Physical Exam  Constitutional: She appears well-developed and well-nourished. No distress.  HENT:  Head: Normocephalic and atraumatic.  Mouth/Throat: No oropharyngeal exudate.  Dry mucus membranes  Eyes: Pupils are equal, round, and reactive to light.  Neck: Normal range of motion. Neck supple.  Cardiovascular: Normal rate, regular rhythm and normal heart sounds.  Exam reveals no gallop and no friction rub.   No murmur heard. Pulmonary/Chest: Effort normal and breath sounds normal. No respiratory distress. She has no wheezes. She has no rales.  Abdominal: Soft. Bowel sounds are normal. She exhibits no distension and no mass. There is no tenderness. There is no rebound and no guarding.  Musculoskeletal: Normal range of motion. She exhibits no edema and no tenderness.  Neurological: She is alert. GCS eye subscore is 4. GCS verbal subscore is 4. GCS motor subscore is 6.  Skin: Skin is warm and dry.  Psychiatric: She has a normal mood and affect.    ED Course  Procedures (including  critical care time) Labs Review Labs Reviewed  MRSA PCR SCREENING - Abnormal; Notable for the following:    MRSA by PCR POSITIVE (*)    All other components within normal limits  COMPREHENSIVE METABOLIC PANEL - Abnormal; Notable for the following:    Sodium 160 (*)    Chloride 123 (*)    Glucose, Bld 106 (*)    BUN 96 (*)    Creatinine, Ser 2.10 (*)    Calcium 10.7 (*)    Total Protein 12.6 (*)    GFR calc non Af Amer 19 (*)    GFR calc Af Amer 22 (*)    All other components within normal limits  CBC WITH DIFFERENTIAL - Abnormal; Notable for the following:    RBC 3.27 (*)    Hemoglobin 8.5 (*)    HCT 29.3 (*)    MCHC 29.0 (*)    RDW 21.5 (*)    nRBC 2 (*)    All other components within normal limits  URINALYSIS, ROUTINE W REFLEX MICROSCOPIC - Abnormal; Notable for the following:    Bilirubin Urine SMALL (*)    Protein, ur 30 (*)    All other components within normal limits  URINE MICROSCOPIC-ADD ON - Abnormal; Notable for the following:    Casts HYALINE CASTS (*)    All other components within normal limits  COMPREHENSIVE METABOLIC PANEL - Abnormal; Notable for the following:    Sodium 160 (*)    Chloride 129 (*)    BUN 84 (*)    Creatinine, Ser 1.66 (*)    Total Protein 10.8 (*)    Albumin 3.0 (*)    GFR calc non Af Amer 26 (*)    GFR calc Af Amer 30 (*)    All other components within normal limits  CBC - Abnormal; Notable for the following:    RBC 3.12 (*)    Hemoglobin 7.8 (*)    HCT 27.4 (*)    MCH 25.0 (*)    MCHC 28.5 (*)    RDW 21.4 (*)    All other components within normal limits  IRON AND TIBC - Abnormal; Notable for the following:    TIBC 177 (*)    All other components within normal limits  HEMOGLOBIN A1C - Abnormal; Notable for the following:    Hemoglobin A1C 5.9 (*)    Mean Plasma Glucose 123 (*)    All other components within normal limits  GLUCOSE, CAPILLARY - Abnormal; Notable for the following:    Glucose-Capillary 47 (*)    All other  components within normal limits  URINE CULTURE  TSH  GLUCOSE, CAPILLARY  OCCULT BLOOD X 1 CARD TO LAB, STOOL  URINALYSIS, ROUTINE W REFLEX MICROSCOPIC    Imaging Review Dg Chest 2  View  07/28/2014   CLINICAL DATA:  Failure to thrive, decreased oral intake, history of dementia  EXAM: CHEST  2 VIEW  COMPARISON:  Chest x-ray from right rib series and July 03, 2014  FINDINGS: The lungs are adequately inflated and clear. The heart is top-normal in size. The pulmonary vascularity is not engorged. There is tortuosity of the descending thoracic aorta. There is no pleural effusion. There is mild stable curvature of the thoracolumbar spine.  IMPRESSION: There is no active cardiopulmonary disease.   Electronically Signed   By: David  [place]   On: 07/28/2014 17:16     EKG Interpretation None      MDM   Final diagnoses:  Dehydration  Acute renal failure, unspecified acute renal failure type  Failure to thrive in adult    Pt is a 80 y.o. female with Pmhx as above who presents with report of dec PO intake for several days. She was placed on cipo today for presumed UTI, but had not urinated to test a urine sample and facility does not cath. She has been unsteady when she walks and spending increased time in a wheelchair. She is in an assisted living facility. She has no known family.  Attempted to call SW/HCPOA given by the facility, but received no call back. Labs show evidence of dehydration w/ Na of 160, doubling of her creatinine. No signs of infection from UA and CXR.  [facility] consulted and will admit.       Toy CookeyMegan Emila Steinhauser, MD 07/29/14 1116

## 2014-07-28 NOTE — H&P (Signed)
[facility] History and Physical  Karla Morgan DOB: [birth year] DOA: 07/28/2014  Referring physician: Geoffery Lyons, MD PCP: Florentina Jenny, MD   Chief Complaint: Dehydrtaion  HPI: Karla Morgan is a 80 y.o. female with baseline history of dementia presents as a transfer from [facility] for dehydration. Patient presented there originally with flank pain. It was felt that she had a UTI and was constipated. Patient had nonspecific gas pattern on the abdominal series and had a negative CXR noted. She did have blood work done and this shows that she has significant hypernatremia and also had worsening of her creatinine. Patient had a urinary evaluation done and was felt to be negative for a UTI. She has no nausea no vomiting no chest pain noted. She did c/o some non-specific abdominal pain however. There is no falls and no recent stroke.    Review of Systems: ROS limited but is unremarkable other than what is noted in the HPI  Past Medical History  Diagnosis Date  . Dementia   . Acid reflux   . High cholesterol   . Hypertension   . Arthritis   . Anxiety   . Glaucoma   . Constipation   . Vitamin D deficiency   . CAP (community acquired pneumonia)   . Alzheimer's dementia    History reviewed. No pertinent past surgical history. Social History:  reports that she does not drink alcohol. Her tobacco and drug histories are not on file.  No Known Allergies  No family history on file.   Prior to Admission medications   Medication Sig Start Date End Date Taking? Authorizing Provider  acetaminophen (TYLENOL) 325 MG tablet Take 650 mg by mouth every 6 (six) hours as needed for pain.    Historical Provider, MD  cholecalciferol (VITAMIN D) 1000 UNITS tablet Take 1,000 Units by mouth daily.    Historical Provider, MD  ferrous sulfate 325 (65 FE) MG tablet Take 325 mg by mouth daily with breakfast.    Historical Provider, MD  fluticasone (FLONASE) 50 MCG/ACT nasal spray  Place 2 sprays into the nose 2 (two) times daily.    Historical Provider, MD  guaifenesin (ROBAFEN) 100 MG/5ML syrup Take 200 mg by mouth every 6 (six) hours as needed for cough.    Historical Provider, MD  omeprazole (PRILOSEC) 20 MG capsule Take 20 mg by mouth daily.    Historical Provider, MD  ondansetron (ZOFRAN-ODT) 4 MG disintegrating tablet Take 4 mg by mouth every 4 (four) hours as needed for nausea or vomiting.    Historical Provider, MD  polyethylene glycol (MIRALAX / GLYCOLAX) packet Take 17 g by mouth daily as needed.    Historical Provider, MD  senna (SENOKOT) 8.6 MG TABS tablet Take 1 tablet by mouth.    Historical Provider, MD  simvastatin (ZOCOR) 5 MG tablet Take 5 mg by mouth at bedtime.    Historical Provider, MD  Travoprost, BAK Free, (TRAVATAN) 0.004 % SOLN ophthalmic solution Place 1 drop into both eyes at bedtime.    Historical Provider, MD   Physical Exam: Filed Vitals:   07/28/14 1520 07/28/14 1822 07/28/14 2015 07/28/14 2121  BP: 147/81 140/80 136/76 127/63  Pulse: 84 82 118 112  Temp: 97.5 F (36.4 C)   97.5 F (36.4 C)  TempSrc: Rectal   Axillary  Resp: 18 20 20 19   SpO2: 97% 93% 100%     Wt Readings from Last 3 Encounters:  No data found for Wt    General:  Appears comfortable no distress Eyes: PERRL, normal lids ENT: lips & tongue are dry Neck: no LAD, masses or thyromegaly Cardiovascular: RRR, no m/r/g. No LE edema. Respiratory: CTA bilaterally, no w/r/r. Normal respiratory effort. Abdomen: soft, ntnd Skin: no rash or induration seen on limited exam Musculoskeletal: grossly normal  Psychiatric: unable to assess Neurologic: unable to assess          Labs on Admission:  Basic Metabolic Panel:  Recent Labs Lab 07/28/14 1618  NA 160*  K 4.0  CL 123*  CO2 25  GLUCOSE 106*  BUN 96*  CREATININE 2.10*  CALCIUM 10.7*   Liver Function Tests:  Recent Labs Lab 07/28/14 1618  AST 35  ALT 21  ALKPHOS 84  BILITOT 0.5  PROT 12.6*  ALBUMIN  3.5   No results found for this basename: LIPASE, AMYLASE,  in the last 168 hours No results found for this basename: AMMONIA,  in the last 168 hours CBC:  Recent Labs Lab 07/28/14 1618  WBC 5.0  NEUTROABS 2.5  HGB 8.5*  HCT 29.3*  MCV 89.6  PLT 337   Cardiac Enzymes: No results found for this basename: CKTOTAL, CKMB, CKMBINDEX, TROPONINI,  in the last 168 hours  BNP (last 3 results) No results found for this basename: PROBNP,  in the last 8760 hours CBG: No results found for this basename: GLUCAP,  in the last 168 hours  Radiological Exams on Admission: Dg Chest 2 View  07/28/2014   CLINICAL DATA:  Failure to thrive, decreased oral intake, history of dementia  EXAM: CHEST  2 VIEW  COMPARISON:  Chest x-ray from right rib series and July 03, 2014  FINDINGS: The lungs are adequately inflated and clear. The heart is top-normal in size. The pulmonary vascularity is not engorged. There is tortuosity of the descending thoracic aorta. There is no pleural effusion. There is mild stable curvature of the thoracolumbar spine.  IMPRESSION: There is no active cardiopulmonary disease.   Electronically Signed   By: David  SwazilandJordan   On: 07/28/2014 17:16     Assessment/Plan Principal Problem:   ARF (acute renal failure) Active Problems:   Dehydration   Essential hypertension, benign   Renal failure   1. ARF -likely related to dehydration with worsening of Creatinine as indicated by hypernatremia -will repeat labs  2. Dehydration -as above will hydrate with IVF normal saline -monitor electrolytes and labs  3. Essential Hypertension -will continue with home medications -Pressure is controlled at this time  4. Hypernatremia -again overall related to dehydration -will gently hydrate -monitor labs  5. Anemia -will check stools for blood -check iron studies    Code Status: Full Code (must indicate code status--if unknown or must be presumed, indicate so) DVT  Prophylaxis:Heparin Family Communication: None (indicate person spoken with, if applicable, with phone number if by telephone) Disposition Plan: SNF (indicate anticipated LOS)  Time spent: 60min  [facility] County[facility]AN,SAADAT A [facility] Pager [phone]  **Disclaimer: This note may have been dictated with voice recognition software. Similar sounding words can inadvertently be transcribed and this note may contain transcription errors which may not have been corrected upon publication of note.**

## 2014-07-28 NOTE — ED Notes (Signed)
[facility] report-pt is resident of [place]-brought in for "failure to thrive"-decreased po intake and decreased UO-pt alert-verbal with dementia-pt FROM-denies pain-states "no" when asked if in pain-pt incontinent of stool-pt with dried stool in attends-pt cleaned-I&O cath for UA-labs drawn-required 3 staff to hold pt due to pt swinging to hit staff and kicking staff

## 2014-07-29 LAB — COMPREHENSIVE METABOLIC PANEL
ALT: 20 U/L (ref 0–35)
AST: 35 U/L (ref 0–37)
Albumin: 3 g/dL — ABNORMAL LOW (ref 3.5–5.2)
Alkaline Phosphatase: 76 U/L (ref 39–117)
Anion gap: 10 (ref 5–15)
BILIRUBIN TOTAL: 0.5 mg/dL (ref 0.3–1.2)
BUN: 84 mg/dL — ABNORMAL HIGH (ref 6–23)
CALCIUM: 9.8 mg/dL (ref 8.4–10.5)
CHLORIDE: 129 meq/L — AB (ref 96–112)
CO2: 21 meq/L (ref 19–32)
Creatinine, Ser: 1.66 mg/dL — ABNORMAL HIGH (ref 0.50–1.10)
GFR, EST AFRICAN AMERICAN: 30 mL/min — AB (ref >90)
GFR, EST NON AFRICAN AMERICAN: 26 mL/min — AB (ref >90)
Glucose, Bld: 91 mg/dL (ref 70–99)
Potassium: 4.3 mEq/L (ref 3.7–5.3)
SODIUM: 160 meq/L — AB (ref 137–147)
Total Protein: 10.8 g/dL — ABNORMAL HIGH (ref 6.0–8.3)

## 2014-07-29 LAB — GLUCOSE, CAPILLARY
GLUCOSE-CAPILLARY: 47 mg/dL — AB (ref 70–99)
GLUCOSE-CAPILLARY: 71 mg/dL (ref 70–99)

## 2014-07-29 LAB — MRSA PCR SCREENING: MRSA BY PCR: POSITIVE — AB

## 2014-07-29 LAB — IRON AND TIBC
IRON: 44 ug/dL (ref 42–135)
Saturation Ratios: 25 % (ref 20–55)
TIBC: 177 ug/dL — ABNORMAL LOW (ref 250–470)
UIBC: 133 ug/dL (ref 125–400)

## 2014-07-29 LAB — CBC
HCT: 27.4 % — ABNORMAL LOW (ref 36.0–46.0)
Hemoglobin: 7.8 g/dL — ABNORMAL LOW (ref 12.0–15.0)
MCH: 25 pg — ABNORMAL LOW (ref 26.0–34.0)
MCHC: 28.5 g/dL — ABNORMAL LOW (ref 30.0–36.0)
MCV: 87.8 fL (ref 78.0–100.0)
Platelets: 315 10*3/uL (ref 150–400)
RBC: 3.12 MIL/uL — ABNORMAL LOW (ref 3.87–5.11)
RDW: 21.4 % — AB (ref 11.5–15.5)
WBC: 4 10*3/uL (ref 4.0–10.5)

## 2014-07-29 LAB — HEMOGLOBIN A1C
Hgb A1c MFr Bld: 5.9 % — ABNORMAL HIGH (ref <5.7)
Mean Plasma Glucose: 123 mg/dL — ABNORMAL HIGH (ref <117)

## 2014-07-29 LAB — TSH: TSH: 0.79 u[IU]/mL (ref 0.350–4.500)

## 2014-07-29 MED ORDER — DEXTROSE 50 % IV SOLN: 25.0000 mL | Freq: Once | INTRAVENOUS | Status: AC | PRN

## 2014-07-29 MED ORDER — DEXTROSE-NACL 5-0.9 % IV SOLN
INTRAVENOUS | Status: DC
  Administered 2014-07-29: 11:00:00 via INTRAVENOUS

## 2014-07-29 MED ORDER — CETYLPYRIDINIUM CHLORIDE 0.05 % MT LIQD: 7.0000 mL | Freq: Two times a day (BID) | OROMUCOSAL | Status: DC

## 2014-07-29 MED ORDER — SODIUM CHLORIDE 0.45 % IV SOLN
INTRAVENOUS | Status: DC
  Administered 2014-07-29: 08:00:00 via INTRAVENOUS

## 2014-07-29 MED ORDER — CHLORHEXIDINE GLUCONATE 0.12 % MT SOLN
15.0000 mL | Freq: Two times a day (BID) | OROMUCOSAL | Status: DC
  Administered 2014-07-29 – 2014-07-30 (×3): 15 mL via OROMUCOSAL
  Filled 2014-07-29 (×4): qty 15

## 2014-07-29 MED ORDER — DEXTROSE 50 % IV SOLN
INTRAVENOUS | Status: AC
  Administered 2014-07-29: 50 mL
  Filled 2014-07-29: qty 50

## 2014-07-29 MED ORDER — SODIUM CHLORIDE 0.9 % IV BOLUS (SEPSIS)
250.0000 mL | Freq: Once | INTRAVENOUS | Status: AC
  Administered 2014-07-29: 250 mL via INTRAVENOUS

## 2014-07-29 MED ORDER — PNEUMOCOCCAL VAC POLYVALENT 25 MCG/0.5ML IJ INJ
0.5000 mL | INJECTION | INTRAMUSCULAR | Status: DC
  Filled 2014-07-29: qty 0.5

## 2014-07-29 NOTE — Progress Notes (Signed)
Notified patient's POA, [name], that patient had fallen without injury.  Informed Ms. Fayrene FearingJames that bed alarm was on at the time of the fall and that after the fall we placed Ms. Andria MeuseStevens in a low bed which also has a bed alarm. Ms. Fayrene FearingJames thanked me for calling. She said it was not necessary to call the patient's legal guardian.  Donnamarie PoagK. Kirby, on call for [facility] has been paged twice.

## 2014-07-29 NOTE — Progress Notes (Addendum)
[facility] PROGRESS NOTE  Karla Morgan ZOX:[id number] DOB: [birth year] DOA: 07/28/2014 PCP: Florentina Jenny, MD  Assessment/Plan: Principal Problem:   ARF (acute renal failure) Active Problems:   Dehydration   Essential hypertension, benign   Renal failure    1. ARF-prerenal -likely related to dehydration with worsening of Creatinine as indicated by hypernatremia  Some improvement with IV hydration  2. Dehydration -as above will hydrate with IVF normal saline  -monitor electrolytes and labs   3. Essential Hypertension -will continue with home medications  -Pressure is controlled at this time   4. Hypernatremia -again overall related to dehydration , poor oral intake in the setting of advanced dementia On half normal saline with D5 because of hypoglycemia this morning Serial BMP today, avoid overcorrection of more than 12 mEq in 24 hours Palliative care consultation requested because of advanced dementia Strongly feel patient is hospice eligible  5. Anemia  -will check stools for blood  -check iron studies Transfuse for hemoglobin less than 7.0  Code Status: full Family Communication: family updated about patient's clinical progress Disposition Plan:  Palliative care consultation today  Brief narrative: Karla Morgan is a 80 y.o. female with baseline history of dementia presents as a transfer from [facility] for dehydration. Patient presented there originally with flank pain. It was felt that she had a UTI and was constipated. Patient had nonspecific gas pattern on the abdominal series and had a negative CXR noted. She did have blood work done and this shows that she has significant hypernatremia and also had worsening of her creatinine. Patient had a urinary evaluation done and was felt to be negative for a UTI. She has no nausea no vomiting no chest pain noted. She did c/o some non-specific abdominal pain however. There is no falls and no recent  stroke   Consultants:  Palliative care  Procedures:  None  Antibiotics:  None  HPI/Subjective: Patient curled  up in bed, nonverbal does not communicate  Objective: Filed Vitals:   07/28/14 2015 07/28/14 2121 07/29/14 0300 07/29/14 0606  BP: 136/76 127/63 135/68 129/62  Pulse: 118 112 114 111  Temp:  97.5 F (36.4 C) 97.6 F (36.4 C) 97.4 F (36.3 C)  TempSrc:  Axillary Axillary Axillary  Resp: 20 19 20 19   Weight:   37.649 kg (83 lb)   SpO2: 100%  95% 94%    Intake/Output Summary (Last 24 hours) at 07/29/14 1209 Last data filed at 07/29/14 0830  Gross per 24 hour  Intake    700 ml  Output     20 ml  Net    680 ml    Exam:  General: Disoriented, dry, chronically ill-appearing Cardiovascular: RRR, nl S1 s2  Respiratory: Decreased breath sounds at the bases, scattered rhonchi, no crackles  Abdomen: soft +BS NT/ND, no masses palpable  Extremities: No cyanosis and no edema      Data Reviewed: Basic Metabolic Panel:  Recent Labs Lab 07/28/14 1618 07/29/14 0439  NA 160* 160*  K 4.0 4.3  CL 123* 129*  CO2 25 21  GLUCOSE 106* 91  BUN 96* 84*  CREATININE 2.10* 1.66*  CALCIUM 10.7* 9.8    Liver Function Tests:  Recent Labs Lab 07/28/14 1618 07/29/14 0439  AST 35 35  ALT 21 20  ALKPHOS 84 76  BILITOT 0.5 0.5  PROT 12.6* 10.8*  ALBUMIN 3.5 3.0*   No results found for this basename: LIPASE, AMYLASE,  in the last 168 hours No results found for  this basename: AMMONIA,  in the last 168 hours  CBC:  Recent Labs Lab 07/28/14 1618 07/29/14 0439  WBC 5.0 4.0  NEUTROABS 2.5  --   HGB 8.5* 7.8*  HCT 29.3* 27.4*  MCV 89.6 87.8  PLT 337 315    Cardiac Enzymes: No results found for this basename: CKTOTAL, CKMB, CKMBINDEX, TROPONINI,  in the last 168 hours BNP (last 3 results) No results found for this basename: PROBNP,  in the last 8760 hours   CBG:  Recent Labs Lab 07/29/14 0759 07/29/14 0852  GLUCAP 47* 71    Recent Results  (from the past 240 hour(s))  MRSA PCR SCREENING     Status: Abnormal   Collection Time    07/29/14  6:58 AM      Result Value Ref Range Status   MRSA by PCR POSITIVE (*) NEGATIVE Final   Comment:            The GeneXpert MRSA Assay (FDA     approved for NASAL specimens     only), is one component of a     comprehensive MRSA colonization     surveillance program. It is not     intended to diagnose MRSA     infection nor to guide or     monitor treatment for     MRSA infections.     RESULT CALLED TO, READ BACK BY AND VERIFIED WITH:     BUTLER RN 8:20 07/29/14 ([email or web address])     Studies: Dg Chest 2 View  07/28/2014   CLINICAL DATA:  Failure to thrive, decreased oral intake, history of dementia  EXAM: CHEST  2 VIEW  COMPARISON:  Chest x-ray from right rib series and July 03, 2014  FINDINGS: The lungs are adequately inflated and clear. The heart is top-normal in size. The pulmonary vascularity is not engorged. There is tortuosity of the descending thoracic aorta. There is no pleural effusion. There is mild stable curvature of the thoracolumbar spine.  IMPRESSION: There is no active cardiopulmonary disease.   Electronically Signed   By: David  Swazil[place]Jordan   On: 07/28/2014 17:16   Dg Ribs Unilateral W/chest Left  07/03/2014   CLINICAL DATA:  Left chest and rib pain.  EXAM: LEFT RIBS AND CHEST - 3+ VIEW  COMPARISON:  06/07/2013 and prior chest radiographs  FINDINGS: Mild cardiomegaly noted.  Minimal scarring at the lung bases noted.  There is no evidence of focal airspace disease, pulmonary edema, suspicious pulmonary nodule/mass, pleural effusion, or pneumothorax. No acute bony abnormalities are identified.  There is no evidence of left rib abnormality or fracture.  IMPRESSION: Mild cardiomegaly without active cardiopulmonary disease. No evidence of left rib abnormality.   Electronically Signed   By: Laveda AbbeJeff  Hu M.D.   On: 07/03/2014 18:35   Dg Abd 1 View  07/03/2014   CLINICAL DATA:  80 year old female with  abdominal pain.  EXAM: ABDOMEN - 1 VIEW  COMPARISON:  06/07/2013 radiographs  FINDINGS: The bowel gas pattern is unremarkable.  There is no evidence of bowel obstruction.  No suspicious calcifications are present.  The scoliosis and degenerative changes within the lumbar spine again noted.  No acute bony abnormalities are identified.  IMPRESSION: No evidence of acute abnormality.  Unremarkable bowel gas pattern.   Electronically Signed   By: Laveda AbbeJeff  Hu M.D.   On: 07/03/2014 18:33    Scheduled Meds: . antiseptic oral rinse  7 mL Mouth Rinse q12n4p  . chlorhexidine  15 mL Mouth Rinse  BID  . cholecalciferol  1,000 Units Oral Daily  . docusate sodium  100 mg Oral BID  . ferrous sulfate  325 mg Oral Q breakfast  . fluticasone  2 spray Each Nare BID  . folic acid  1 mg Oral Daily  . heparin  5,000 Units Subcutaneous 3 times per day  . latanoprost  1 drop Both Eyes QHS  . multivitamin with minerals  1 tablet Oral Daily  . pantoprazole  40 mg Oral Daily  . pneumococcal 23 valent vaccine  0.5 mL Intramuscular Tomorrow-1000  . simvastatin  5 mg Oral QHS  . sodium chloride  3 mL Intravenous Q12H  . thiamine  100 mg Oral Daily   Continuous Infusions: . dextrose 5 % and 0.9% NaCl 75 mL/hr at 07/29/14 1051    Principal Problem:   ARF (acute renal failure) Active Problems:   Dehydration   Essential hypertension, benign   Renal failure    Time spent: 40 minutes   [facility]  [facility] Pager [phone]. If 7PM-7AM, please contact night-coverage at [email or web address], password [facility] 07/29/2014, 12:09 PM  LOS: 1 day

## 2014-07-29 NOTE — Progress Notes (Signed)
Patient ZO:[id number]:Karla Morgan      DOB: 10-Nov-1920      WUJ:[id number]  Spoke with Karla Morgan (attorney in charge of her estate) who offers any financial assistance that would allow Karla . Karla Morgan to live out her life comfortably.  I have left and message for Karla Morgan and attempted to recall her this afternoon.  Phone goes to voicemail.  The Palliative Medicine Team will attempt to reach her again in the am.   Tarell Schollmeyer L. Ladona Ridgelaylor, MD MBA The Palliative Medicine Team at [facility] Team Phone: [phone] Pager: [phone] ( Use team phone after hours)

## 2014-07-29 NOTE — Evaluation (Signed)
Clinical/Bedside Swallow Evaluation Patient Details  Name: Karla Morgan MRN: [id number] Date of Birth: 12-07-1920  Today's Date: 07/29/2014 Time: [id number] SLP Time Calculation (min): 21 min  Past Medical History:  Past Medical History  Diagnosis Date  . Dementia   . Acid reflux   . High cholesterol   . Hypertension   . Arthritis   . Anxiety   . Glaucoma   . Constipation   . Vitamin D deficiency   . CAP (community acquired pneumonia)   . Alzheimer's dementia    Past Surgical History: History reviewed. No pertinent past surgical history. HPI:  Karla SavageDelores R Herringshaw is a 80 y.o. female with baseline history of dementia who [facility] and [facility]. PMH also significant for acid reflux.   Assessment / Plan / Recommendation Clinical Impression  Pt appears to have a primarily cognitively based dysphagia with oral holding and suspected premature spillage of thin liquids leading to immediate cough response s/p large cup sips. Max faded to Mod multimodal cueing from SLP to control sip size was effective at eliminating cough response throughout the remainder of trials. Pt required Max cues from therapist to initiate oral transit of puree. Recommend to advance diet to Dys 1 textures and thin liquids with no straws. Will follow for tolerance.    Aspiration Risk  Moderate    Diet Recommendation Dysphagia 1 (Puree);Thin liquid   Liquid Administration via: Cup;No straw Medication Administration: Crushed with puree Supervision: Patient able to self feed;Full supervision/cueing for compensatory strategies Compensations: Slow rate;Small sips/bites (check for oral holding) Postural Changes and/or Swallow Maneuvers: Seated upright 90 degrees;Upright 30-60 min after meal    Other  Recommendations Oral Care Recommendations: Oral care BID   Follow Up Recommendations  Skilled Nursing facility    Frequency and Duration min 2x/week  2 weeks    Pertinent Vitals/Pain n/a    SLP Swallow Goals     Swallow Study Prior Functional Status       General Date of Onset: 07/28/14 HPI: Karla SavageDelores R Uriarte is a 80 y.o. female with baseline history of dementia who [facility] and [facility] renal failure. PMH also significant for acid reflux. Type of Study: Bedside swallow evaluation Previous Swallow Assessment: none in chart Diet Prior to this Study: Thin liquids;Other (Comment) (full liquid) Temperature Spikes Noted: No Respiratory Status: Room air History of Recent Intubation: No Behavior/Cognition: Alert;Cooperative;Pleasant mood;Confused;Requires cueing Oral Cavity - Dentition: Edentulous Self-Feeding Abilities: Able to feed self;Needs assist Patient Positioning: Upright in bed Baseline Vocal Quality: Clear    Oral/Motor/Sensory Function     Ice Chips Ice chips: Not tested   Thin Liquid Thin Liquid: Impaired Presentation: Cup;Self Fed;Straw (unable to get liquid up via straw) Oral Phase Impairments: Poor awareness of bolus Oral Phase Functional Implications: Oral holding Pharyngeal  Phase Impairments: Suspected delayed Swallow;Cough - Immediate    Nectar Thick Nectar Thick Liquid: Not tested   Honey Thick Honey Thick Liquid: Not tested   Puree Puree: Impaired Presentation: Spoon;Self Fed Oral Phase Impairments: Poor awareness of bolus Oral Phase Functional Implications: Oral holding Pharyngeal Phase Impairments: Suspected delayed Swallow   Solid   GO    Solid: Not tested        Maxcine HamLaura Paiewonsky, M.A. CCC-SLP [phone]  Maxcine Hamaiewonsky, Baileigh Modisette 07/29/2014,3:46 PM

## 2014-07-29 NOTE — Consult Note (Signed)
Patient UE:[id number]:Aniella Leonel RamsayR Nedeau      DOB: [birth year]      XBJ:[id number]   I have seen and examined Mrs. Andria MeuseStevens.  I have left a message for [name] who is the physical Guardian for [name].  I have also inquired of Attorney Daughtry, she is the Guardian of the patient's estate.  Attorney Daughtry's office referred me to her physical guardian Mrs. Fayrene FearingJames.   Ayra Hodgdon L. Ladona Ridgelaylor, MD MBA The Palliative Medicine Team at [facility] Team Phone: [phone] Pager: [phone] ( Use team phone after hours)

## 2014-07-29 NOTE — Progress Notes (Addendum)
0300 pt's bed alarm sounding;went to room ;pt fell on floor.  No skin breaks or visible injuries observed.  Assisted patient back to bed.  Patient moving all extremities without pain or complaint.  Patient remains alert but confused and not following commands which was her baseline on admission. Notified [name], patient's POA, of patient's fall.  Paged Donnamarie PoagK. Kirby x2.  Notified Dr. Susie CassetteAbrol of patient's fall. Also notified her that patient's telemetry was d/c'd per Kirby's order- patient was unable to keep tele on even with mittens. Patient placed in low bed with bed alarm and safety sitter at bedside.

## 2014-07-30 LAB — CBC
HCT: 29.1 % — ABNORMAL LOW (ref 36.0–46.0)
Hemoglobin: 8.3 g/dL — ABNORMAL LOW (ref 12.0–15.0)
MCH: 25.3 pg — ABNORMAL LOW (ref 26.0–34.0)
MCHC: 28.5 g/dL — ABNORMAL LOW (ref 30.0–36.0)
MCV: 88.7 fL (ref 78.0–100.0)
PLATELETS: 302 10*3/uL (ref 150–400)
RBC: 3.28 MIL/uL — AB (ref 3.87–5.11)
RDW: 21.8 % — ABNORMAL HIGH (ref 11.5–15.5)
WBC: 3.3 10*3/uL — ABNORMAL LOW (ref 4.0–10.5)

## 2014-07-30 LAB — COMPREHENSIVE METABOLIC PANEL WITH GFR
ALT: 19 U/L (ref 0–35)
AST: 40 U/L — ABNORMAL HIGH (ref 0–37)
Albumin: 2.5 g/dL — ABNORMAL LOW (ref 3.5–5.2)
Alkaline Phosphatase: 69 U/L (ref 39–117)
BUN: 53 mg/dL — ABNORMAL HIGH (ref 6–23)
CO2: 20 meq/L (ref 19–32)
Calcium: 9.3 mg/dL (ref 8.4–10.5)
Chloride: >130 meq/L (ref 96–112)
Creatinine, Ser: 1.22 mg/dL — ABNORMAL HIGH (ref 0.50–1.10)
GFR calc Af Amer: 43 mL/min — ABNORMAL LOW
GFR calc non Af Amer: 37 mL/min — ABNORMAL LOW
Glucose, Bld: 96 mg/dL (ref 70–99)
Potassium: 4.6 meq/L (ref 3.7–5.3)
Sodium: 163 meq/L (ref 137–147)
Total Bilirubin: 0.4 mg/dL (ref 0.3–1.2)
Total Protein: 9.4 g/dL — ABNORMAL HIGH (ref 6.0–8.3)

## 2014-07-30 LAB — BASIC METABOLIC PANEL
BUN: 45 mg/dL — ABNORMAL HIGH (ref 6–23)
CALCIUM: 9.3 mg/dL (ref 8.4–10.5)
CO2: 21 meq/L (ref 19–32)
Creatinine, Ser: 1.1 mg/dL (ref 0.50–1.10)
GFR calc non Af Amer: 42 mL/min — ABNORMAL LOW (ref >90)
GFR, EST AFRICAN AMERICAN: 49 mL/min — AB (ref >90)
Glucose, Bld: 112 mg/dL — ABNORMAL HIGH (ref 70–99)
Potassium: 4.1 mEq/L (ref 3.7–5.3)
SODIUM: 164 meq/L — AB (ref 137–147)

## 2014-07-30 LAB — URINE CULTURE
CULTURE: NO GROWTH
Colony Count: NO GROWTH

## 2014-07-30 LAB — GLUCOSE, CAPILLARY
Glucose-Capillary: 81 mg/dL (ref 70–99)
Glucose-Capillary: 114 mg/dL — ABNORMAL HIGH (ref 70–99)

## 2014-07-30 LAB — OCCULT BLOOD X 1 CARD TO LAB, STOOL: Fecal Occult Bld: POSITIVE — AB

## 2014-07-30 MED ORDER — MUPIROCIN 2 % EX OINT
1.0000 "application " | TOPICAL_OINTMENT | Freq: Two times a day (BID) | CUTANEOUS | Status: AC
  Administered 2014-07-30 – 2014-08-03 (×8): 1 via NASAL
  Filled 2014-07-30 (×2): qty 22

## 2014-07-30 MED ORDER — SODIUM CHLORIDE 0.9 % IV BOLUS (SEPSIS)
250.0000 mL | Freq: Once | INTRAVENOUS | Status: AC
  Administered 2014-07-30: 250 mL via INTRAVENOUS

## 2014-07-30 MED ORDER — DEXTROSE-NACL 5-0.2 % IV SOLN
INTRAVENOUS | Status: DC
  Administered 2014-07-30 – 2014-07-31 (×3): via INTRAVENOUS

## 2014-07-30 MED ORDER — POTASSIUM CL IN DEXTROSE 5% 20 MEQ/L IV SOLN: 20.0000 meq | INTRAVENOUS | Status: DC

## 2014-07-30 MED ORDER — CHLORHEXIDINE GLUCONATE CLOTH 2 % EX PADS
6.0000 | MEDICATED_PAD | Freq: Every day | CUTANEOUS | Status: AC
  Administered 2014-07-30 – 2014-08-03 (×5): 6 via TOPICAL

## 2014-07-30 NOTE — Progress Notes (Signed)
Patient Karla Morgan      DOB: [birth year]      NWG:[id number]   Still [facility]. I called again today and left message to call our PMT phone back.  Patient has sitter at bedside who has not seen family. Patient took few bites of food and verbalized a few words through day to sitter.  Ongoing hypernatremia may be worsening her mentation from baseline as well. Will await call back over weekend. Feel free to contact our team if able to get a hold of family.  Orvis BrillAaron J. Cecily Lawhorne D.O. Palliative Medicine Team at [facility]  Pager: [phone] Team Phone: [phone]

## 2014-07-30 NOTE — Progress Notes (Signed)
CRITICAL VALUE ALERT  Critical value received:  Sodium 163, Chloride greater than 130  Date of notification: 07/30/2014  Time of notification:  11:10  Critical value read back:Yes.    Nurse who received alert:  Sherene SiresBettie Jo Naethan Bracewell, RN  MD notified (1st page):  Abrol  Time of first page:  11:10  MD notified (2nd page):Abrol  Time of second page:11:38  Responding MD:  Susie CassetteAbrol  Time MD responded:  11:44

## 2014-07-30 NOTE — Progress Notes (Signed)
Speech Language Pathology Treatment: Dysphagia  Patient Details Name: Karla Morgan MRN: [id number] DOB: Jul 08, 1920 Today's Date: 07/30/2014 Time: [id number] SLP Time Calculation (min): 15 min  Assessment / Plan / Recommendation Clinical Impression  Skilled treatment session focused on addressing dysphagia goals.  Patient in bed with sitter at side and consumed 3 teaspoons puree with Total assist with no overt s/s of aspiration.  Patient verbally agreeable to sips of water; however, physically resistant to hand over hand assist.  As a result, thin liquid trials were not observed today.  Sitter/Nurse Tech reports that patient consumed minimal amounts of breakfast with cough x1 on initial cup sips of thin liquids this am with use of all recommend safe swallow strategies.  Patient appears to be tolerating current diet with what SLP was able to observe; recommend to continue with current orders and plan for SLP to follow up x1 in hopes of observing thin liquid consumption.     HPI HPI: Karla SavageDelores R Schalk is a 80 y.o. female with baseline history of dementia who [facility] and acute renal failure. PMH also significant for acid reflux.   Pertinent Vitals Pain Assessment: No/denies pain  SLP Plan  Continue with current plan of care    Recommendations Diet recommendations: Dysphagia 1 (puree);Thin liquid Liquids provided via: Cup;No straw Medication Administration: Crushed with puree Supervision: Patient able to self feed;Full supervision/cueing for compensatory strategies Compensations: Slow rate;Small sips/bites (check for oral holding) Postural Changes and/or Swallow Maneuvers: Seated upright 90 degrees;Upright 30-60 min after meal              Oral Care Recommendations: Oral care BID Follow up Recommendations: Skilled Nursing facility;24 hour supervision/assistance Plan: Continue with current plan of care    GO     Charlane FerrettiMelissa Cartina Brousseau, M.A.,  CCC-SLP [phone]  Dessie Tatem 07/30/2014, 1:05 PM

## 2014-07-30 NOTE — Progress Notes (Signed)
[facility] PROGRESS NOTE  Karla Morgan ZOX:[id number] DOB: 09-Oct-1920 DOA: 07/28/2014 PCP: Florentina Jenny, MD  Assessment/Plan: Principal Problem:   ARF (acute renal failure) Active Problems:   Dehydration   Essential hypertension, benign   Renal failure    1. ARF-prerenal -likely related to dehydration with improving Creatinine  Some improvement with IV hydration  2. Dehydration Continue IV fluids -monitor electrolytes and labs  3. Essential Hypertension -will continue with home medications  -Pressure is controlled at this time  4. Hypernatremia Change fluids to D51/4 normal saline, sodium worsening -again overall related to dehydration , poor oral intake in the setting of advanced dementia  On half normal saline with D5 because of hypoglycemia this morning  Serial BMP today, avoid overcorrection of more than 12 mEq in 24 hours  Palliative care consultation requested because of advanced dementia  Strongly feel patient is hospice eligible   5. Anemia  -will check stools for blood  -check iron studies  Transfuse for hemoglobin less than 7.0   Code Status: full  Family Communication: family updated about patient's clinical progress  Disposition Plan: Palliative care consultation today  Brief narrative:  Karla Morgan is a 80 y.o. female with baseline history of dementia presents as a transfer from [facility] for dehydration. Patient presented there originally with flank pain. It was felt that she had a UTI and was constipated. Patient had nonspecific gas pattern on the abdominal series and had a negative CXR noted. She did have blood work done and this shows that she has significant hypernatremia and also had worsening of her creatinine. Patient had a urinary evaluation done and was felt to be negative for a UTI. She has no nausea no vomiting no chest pain noted. She did c/o some non-specific abdominal pain however. There is no falls and no recent stroke  Consultants:   Palliative care Procedures:  None Antibiotics:  None HPI/Subjective:  Patient curled up in bed, communicative but confused, does not have an appetite     Objective: Filed Vitals:   07/29/14 1348 07/29/14 2129 07/30/14 0601 07/30/14 1300  BP: 130/90 106/66 112/49 103/57  Pulse: 91 99 98 104  Temp: 97.3 F (36.3 C) 98.4 F (36.9 C) 97.6 F (36.4 C) 97.4 F (36.3 C)  TempSrc: Axillary Oral Axillary Axillary  Resp: 18 18 16 17   Weight:   44.453 kg (98 lb)   SpO2: 96% 100% 93% 81%    Intake/Output Summary (Last 24 hours) at 07/30/14 1425 Last data filed at 07/30/14 0934  Gross per 24 hour  Intake      0 ml  Output      0 ml  Net      0 ml    Exam:  General: alert & oriented x one chronically ill-appearing Cardiovascular: RRR, nl S1 s2  Respiratory: Decreased breath sounds at the bases, scattered rhonchi, no crackles  Abdomen: soft +BS NT/ND, no masses palpable  Extremities: No cyanosis and no edema      Data Reviewed: Basic Metabolic Panel:  Recent Labs Lab 07/28/14 1618 07/29/14 0439 07/30/14 0828  NA 160* 160* 163*  K 4.0 4.3 4.6  CL 123* 129* >130*  CO2 25 21 20   GLUCOSE 106* 91 96  BUN 96* 84* 53*  CREATININE 2.10* 1.66* 1.22*  CALCIUM 10.7* 9.8 9.3    Liver Function Tests:  Recent Labs Lab 07/28/14 1618 07/29/14 0439 07/30/14 0828  AST 35 35 40*  ALT 21 20 19   ALKPHOS 84 76  69  BILITOT 0.5 0.5 0.4  PROT 12.6* 10.8* 9.4*  ALBUMIN 3.5 3.0* 2.5*   No results found for this basename: LIPASE, AMYLASE,  in the last 168 hours No results found for this basename: AMMONIA,  in the last 168 hours  CBC:  Recent Labs Lab 07/28/14 1618 07/29/14 0439 07/30/14 0420  WBC 5.0 4.0 3.3*  NEUTROABS 2.5  --   --   HGB 8.5* 7.8* 8.3*  HCT 29.3* 27.4* 29.1*  MCV 89.6 87.8 88.7  PLT 337 315 302    Cardiac Enzymes: No results found for this basename: CKTOTAL, CKMB, CKMBINDEX, TROPONINI,  in the last 168 hours BNP (last 3 results) No results  found for this basename: PROBNP,  in the last 8760 hours   CBG:  Recent Labs Lab 07/29/14 0759 07/29/14 0852 07/30/14 0802  GLUCAP 47* 71 81    Recent Results (from the past 240 hour(s))  URINE CULTURE     Status: None   Collection Time    07/28/14  4:36 PM      Result Value Ref Range Status   Specimen Description URINE, CATHETERIZED   Final   Special Requests NONE   Final   Culture  Setup Time     Final   Value: 07/29/2014 12:39     Performed at [facility] Count     Final   Value: NO GROWTH     Performed at [facility]   Culture     Final   Value: NO GROWTH     Performed at [facility]   Report Status 07/30/2014 FINAL   Final  MRSA PCR SCREENING     Status: Abnormal   Collection Time    07/29/14  6:58 AM      Result Value Ref Range Status   MRSA by PCR POSITIVE (*) NEGATIVE Final   Comment:            The GeneXpert MRSA Assay (FDA     approved for NASAL specimens     only), is one component of a     comprehensive MRSA colonization     surveillance program. It is not     intended to diagnose MRSA     infection nor to guide or     monitor treatment for     MRSA infections.     RESULT CALLED TO, READ BACK BY AND VERIFIED WITH:     BUTLER RN 8:20 07/29/14 ([email or web address])     Studies: Dg Chest 2 View  07/28/2014   CLINICAL DATA:  Failure to thrive, decreased oral intake, history of dementia  EXAM: CHEST  2 VIEW  COMPARISON:  Chest x-ray from right rib series and July 03, 2014  FINDINGS: The lungs are adequately inflated and clear. The heart is top-normal in size. The pulmonary vascularity is not engorged. There is tortuosity of the descending thoracic aorta. There is no pleural effusion. There is mild stable curvature of the thoracolumbar spine.  IMPRESSION: There is no active cardiopulmonary disease.   Electronically Signed   By: David  [place]   On: 07/28/2014 17:16   Dg Ribs Unilateral W/chest Left  07/03/2014   CLINICAL DATA:  Left  chest and rib pain.  EXAM: LEFT RIBS AND CHEST - 3+ VIEW  COMPARISON:  06/07/2013 and prior chest radiographs  FINDINGS: Mild cardiomegaly noted.  Minimal scarring at the lung bases noted.  There is no evidence of focal airspace disease, pulmonary edema,  suspicious pulmonary nodule/mass, pleural effusion, or pneumothorax. No acute bony abnormalities are identified.  There is no evidence of left rib abnormality or fracture.  IMPRESSION: Mild cardiomegaly without active cardiopulmonary disease. No evidence of left rib abnormality.   Electronically Signed   By: Laveda AbbeJeff  Hu M.D.   On: 07/03/2014 18:35   Dg Abd 1 View  07/03/2014   CLINICAL DATA:  80 year old female with abdominal pain.  EXAM: ABDOMEN - 1 VIEW  COMPARISON:  06/07/2013 radiographs  FINDINGS: The bowel gas pattern is unremarkable.  There is no evidence of bowel obstruction.  No suspicious calcifications are present.  The scoliosis and degenerative changes within the lumbar spine again noted.  No acute bony abnormalities are identified.  IMPRESSION: No evidence of acute abnormality.  Unremarkable bowel gas pattern.   Electronically Signed   By: Laveda AbbeJeff  Hu M.D.   On: 07/03/2014 18:33    Scheduled Meds: . antiseptic oral rinse  7 mL Mouth Rinse q12n4p  . chlorhexidine  15 mL Mouth Rinse BID  . Chlorhexidine Gluconate Cloth  6 each Topical Q0600  . cholecalciferol  1,000 Units Oral Daily  . docusate sodium  100 mg Oral BID  . ferrous sulfate  325 mg Oral Q breakfast  . fluticasone  2 spray Each Nare BID  . folic acid  1 mg Oral Daily  . heparin  5,000 Units Subcutaneous 3 times per day  . latanoprost  1 drop Both Eyes QHS  . multivitamin with minerals  1 tablet Oral Daily  . mupirocin ointment  1 application Nasal BID  . pantoprazole  40 mg Oral Daily  . pneumococcal 23 valent vaccine  0.5 mL Intramuscular Tomorrow-1000  . simvastatin  5 mg Oral QHS  . sodium chloride  250 mL Intravenous Once  . sodium chloride  3 mL Intravenous Q12H  .  thiamine  100 mg Oral Daily   Continuous Infusions: . dextrose 5 % and 0.2 % NaCl      Principal Problem:   ARF (acute renal failure) Active Problems:   Dehydration   Essential hypertension, benign   Renal failure    Time spent: 40 minutes   [facility] Hospital[facility],Francella Barnett  [facility] Pager [phone]. If 7PM-7AM, please contact night-coverage at [email or web address], password [email or web address] 07/30/2014, 2:25 PM  LOS: 2 days

## 2014-07-30 NOTE — Progress Notes (Signed)
CRITICAL VALUE ALERT  Critical value received:  Na+ 164  Chloride greater than 130  Date of notification:  07/30/2014  Time of notification:  2320  Critical value read back: Yes  Nurse who received alert:  Jarrett Ablesachel Marjorie Lussier RN  MD notified (1st page):  Yes  Time of first page:  2336  MD notified (2nd page):N/A  Time of second page:N/A  Responding MD:  M. Lynch  Time MD responded:  2340

## 2014-07-30 NOTE — Care Management Note (Signed)
  Page 2 of 2   08/05/2014     2:22:41 PM CARE MANAGEMENT NOTE 08/05/2014  Patient:  Karla Morgan,Karla Morgan   Account Number:  [id number]  Date Initiated:  07/30/2014  Documentation initiated by:  Ronny FlurryWILE,Tage Feggins  Subjective/Objective Assessment:     Action/Plan:   Anticipated DC Date:     Anticipated DC Plan:  SKILLED NURSING FACILITY  In-house referral  Clinical Social Worker         Choice offered to / List presented to:             Status of service:   Medicare Important Message given?  YES (If response is "NO", the following Medicare IM given date fields will be blank) Date Medicare IM given:  08/05/2014 Medicare IM given by:  Ronny FlurryWILE,Lyonel Morejon Date Additional Medicare IM given:   Additional Medicare IM given by:    Discharge Disposition:    Per UR Regulation:  Reviewed for med. necessity/level of care/duration of stay  If discussed at Long Length of Stay Meetings, dates discussed:   08/03/2014  08/04/2014    Comments:  08-03-14 1015 Dr Ladona Ridgelaylor spoke with risk management , in this situation there is a guardian and two MD's can NOT make decision to change to comfort care.  Suggestion was made to call DSS office speak to supervisor and have POA call MD back or have supervisor reasign POA . [name] with SW aware and messages left for [name] SW and Dr Jacky KindleAronson.  Asked SW to call Dr Ladona Ridgelaylor .   Ronny FlurryHeather Aiko Belko RN BSN [phone]    08-03-14 0950  Dr Ladona Ridgelaylor unable to reach guardian . Patient discussed in LOS this am , per Dr Jacky KindleAronson 2 MD's from different practices can change care to comfort care . Discussed with Dr Ladona Ridgelaylor , she will call risk management to confirme, since there is a guardian . Ronny FlurryHeather Jhoana Upham RN BSN [phone]     08-02-14 Dr Ladona Ridgelaylor just called [facility] , she is still unable to get in touch with Crist FatBrenda Morgan . There is a note dated 07-30-14 that [name] SW spoke to her .  Dr Ladona Ridgelaylor concerned and will keep calling , but multiple calls have been made. Called Almira CoasterGina ( covering SW ) and Pharmacist, communityZach assistant  Director of SW to see what needs to be done . Awaiting call back. Ronny FlurryHeather Remie Mathison RN BSN [phone]    07-30-14 Called  Crist FatBrenda Morgan who is the physical Guardian for Mrs Karla Morgan, to explain IM . Left message . Awaiting call back. Admitting documented today they mailed IM. Ronny FlurryHeather Shahara Hartsfield RN BSN [phone]

## 2014-07-30 NOTE — Progress Notes (Signed)
Patient has been refusing the majority of her medications, and has been blocking her face, or pushing staff away.  The patient did take one of her meds in chocolate pudding.

## 2014-07-30 NOTE — Progress Notes (Signed)
SW spoke with [name], DSS [phone] or [phone] and advised that [facility] have made several attempts to contact. DSS worker reported that no one has called nor left any messages however this writer has attempted twice. DSS worker reports that DSS became involved because pt was in a self neglectful situation. Pt has no children and her husband died many years ago. Pt only has a niece that lives quite a distant. SW advised DSS worker staff will continue to contact to discuss plan of care.   Karla Morgan, MSW Clinical Social Worker [phone]

## 2014-07-30 NOTE — Progress Notes (Addendum)
Clinical Social Work Department BRIEF PSYCHOSOCIAL ASSESSMENT 07/30/2014  Patient:  Ellis SavageSTEVENS,Evangaline R     Account Number:  [id number]     Admit date:  07/28/2014  Clinical Social Worker:  Cristy FolksBEST,Maekayla Giorgio, LCSW  Date/Time:  07/30/2014 11:39 AM  Referred by:  Physician  Date Referred:  07/30/2014 Referred for  SNF Placement   Other Referral:   Interview type:  Other - See comment Other interview type:   Staff at [facility] [place] [phone]    PSYCHOSOCIAL DATA Living Status:  FACILITY Admitted from facility:   Level of care:   Primary support name:  [name] of DSS [place], [phone] or [phone]. Primary support relationship to patient:   Degree of support available:   Adequate Support    CURRENT CONCERNS  Other Concerns:    SOCIAL WORK ASSESSMENT / PLAN [facility] [place] and was transported to hospital due to decrease intake, nausea, vomiting and dehydration. Pt has a guardian with [facility] [place], DSS [phone] or [phone]. Pt also has an atty, [employer]Health[employer]anda Daughtry, [phone] who continues to handle her matters.    Assessment/plan status:  No Further Intervention Required Other assessment/ plan:   N/A   Information/referral to community resources:   N/A    PATIENT'S/FAMILY'S RESPONSE TO PLAN OF CARE: SW will continue to follow.     Derrell Lollingoris Demaria Deeney, MSW Clinical Social Worker [phone]

## 2014-07-31 LAB — GLUCOSE, CAPILLARY: Glucose-Capillary: 93 mg/dL (ref 70–99)

## 2014-07-31 NOTE — Progress Notes (Signed)
[facility] PROGRESS NOTE  [name]:[id number] DOB: [birth year] DOA: 07/28/2014 PCP: Florentina Jenny, MD  Assessment/Plan: Principal Problem:   ARF (acute renal failure) Active Problems:   Dehydration   Essential hypertension, benign   Renal failure    1. ARF-prerenal -likely related to dehydration with improving Creatinine Some improvement with IV hydration  2. Dehydration Continue IV fluids -currently on 1/4 normal saline She will remain on IV fluids until by mouth intake improved which is unlikely to happen 3. Essential Hypertension -will continue with home medications  -Pressure is controlled at this time  4. Hypernatremia Continue D51/4 normal saline, sodium worsening , increase rate to 125 cc per hour -again overall related to dehydration , poor oral intake in the setting of advanced dementia  On half normal saline with D5 because of recurrent hypoglycemia Serial BMP today, avoid overcorrection of more than 12 mEq in 24 hours  Palliative care consultation requested because of advanced dementia  Strongly feel patient is hospice eligible    5. Anemia  FOBT positive, recheck CBC tomorrow -check iron studies  Transfuse for hemoglobin less than 7.0    Code Status: full  Family Communication: family updated about patient's clinical progress  Disposition Plan: Palliative care consultation pending  Brief narrative:  Karla Morgan is a 80 y.o. female with baseline history of dementia presents as a transfer [facility]. Patient presented there originally with flank pain. It was felt that she had a UTI and was constipated. Patient had nonspecific gas pattern on the abdominal series and had a negative CXR noted. She did have blood work done and this shows that she has significant hypernatremia and also had worsening of her creatinine. Patient had a urinary evaluation done and was felt to be negative for a UTI. She has no nausea no vomiting no chest  pain noted. She did c/o some non-specific abdominal pain however. There is no falls and no recent stroke  Consultants:  Palliative care Procedures:  None Antibiotics:  None HPI/Subjective:  Patient curled up in bed, communicative but confused, does not have an appetite  Objective: Filed Vitals:   07/30/14 1300 07/30/14 1441 07/30/14 2100 07/31/14 0519  BP: 103/57  112/42 141/55  Pulse: 104  63 88  Temp: 97.4 F (36.3 C)  98.7 F (37.1 C) 97.5 F (36.4 C)  TempSrc: Axillary  Axillary Axillary  Resp: 17  18 20   Weight:    43.999 kg (97 lb)  SpO2: 81% 91% 98% 95%    Intake/Output Summary (Last 24 hours) at 07/31/14 1156 Last data filed at 07/31/14 0900  Gross per 24 hour  Intake      0 ml  Output      0 ml  Net      0 ml    Exam:  General: alert & oriented x 3 In NAD  Cardiovascular: RRR, nl S1 s2  Respiratory: Decreased breath sounds at the bases, scattered rhonchi, no crackles  Abdomen: soft +BS NT/ND, no masses palpable  Extremities: No cyanosis and no edema      Data Reviewed: Basic Metabolic Panel:  Recent Labs Lab 07/28/14 1618 07/29/14 0439 07/30/14 0828 07/30/14 2141  NA 160* 160* 163* 164*  K 4.0 4.3 4.6 4.1  CL 123* 129* >130* >130*  CO2 25 21 20 21   GLUCOSE 106* 91 96 112*  BUN 96* 84* 53* 45*  CREATININE 2.10* 1.66* 1.22* 1.10  CALCIUM 10.7* 9.8 9.3 9.3    Liver Function  Tests:  Recent Labs Lab 07/28/14 1618 07/29/14 0439 07/30/14 0828  AST 35 35 40*  ALT 21 20 19   ALKPHOS 84 76 69  BILITOT 0.5 0.5 0.4  PROT 12.6* 10.8* 9.4*  ALBUMIN 3.5 3.0* 2.5*   No results found for this basename: LIPASE, AMYLASE,  in the last 168 hours No results found for this basename: AMMONIA,  in the last 168 hours  CBC:  Recent Labs Lab 07/28/14 1618 07/29/14 0439 07/30/14 0420  WBC 5.0 4.0 3.3*  NEUTROABS 2.5  --   --   HGB 8.5* 7.8* 8.3*  HCT 29.3* 27.4* 29.1*  MCV 89.6 87.8 88.7  PLT 337 315 302    Cardiac Enzymes: No results found  for this basename: CKTOTAL, CKMB, CKMBINDEX, TROPONINI,  in the last 168 hours BNP (last 3 results) No results found for this basename: PROBNP,  in the last 8760 hours   CBG:  Recent Labs Lab 07/29/14 0759 07/29/14 0852 07/30/14 0802 07/30/14 2141 07/31/14 0757  GLUCAP 47* 71 81 114* 93    Recent Results (from the past 240 hour(s))  URINE CULTURE     Status: None   Collection Time    07/28/14  4:36 PM      Result Value Ref Range Status   Specimen Description URINE, CATHETERIZED   Final   Special Requests NONE   Final   Culture  Setup Time     Final   Value: 07/29/2014 12:39     Performed at [facility] Count     Final   Value: NO GROWTH     Performed at [facility]   Culture     Final   Value: NO GROWTH     Performed at [facility]   Report Status 07/30/2014 FINAL   Final  MRSA PCR SCREENING     Status: Abnormal   Collection Time    07/29/14  6:58 AM      Result Value Ref Range Status   MRSA by PCR POSITIVE (*) NEGATIVE Final   Comment:            The GeneXpert MRSA Assay (FDA     approved for NASAL specimens     only), is one component of a     comprehensive MRSA colonization     surveillance program. It is not     intended to diagnose MRSA     infection nor to guide or     monitor treatment for     MRSA infections.     RESULT CALLED TO, READ BACK BY AND VERIFIED WITH:     BUTLER RN 8:20 07/29/14 ([email or web address])     Studies: Dg Chest 2 View  07/28/2014   CLINICAL DATA:  Failure to thrive, decreased oral intake, history of dementia  EXAM: CHEST  2 VIEW  COMPARISON:  Chest x-ray from right rib series and July 03, 2014  FINDINGS: The lungs are adequately inflated and clear. The heart is top-normal in size. The pulmonary vascularity is not engorged. There is tortuosity of the descending thoracic aorta. There is no pleural effusion. There is mild stable curvature of the thoracolumbar spine.  IMPRESSION: There is no active cardiopulmonary  disease.   Electronically Signed   By: David  [place]   On: 07/28/2014 17:16   Dg Ribs Unilateral W/chest Left  07/03/2014   CLINICAL DATA:  Left chest and rib pain.  EXAM: LEFT RIBS AND CHEST - 3+ VIEW  COMPARISON:  06/07/2013 and prior chest radiographs  FINDINGS: Mild cardiomegaly noted.  Minimal scarring at the lung bases noted.  There is no evidence of focal airspace disease, pulmonary edema, suspicious pulmonary nodule/mass, pleural effusion, or pneumothorax. No acute bony abnormalities are identified.  There is no evidence of left rib abnormality or fracture.  IMPRESSION: Mild cardiomegaly without active cardiopulmonary disease. No evidence of left rib abnormality.   Electronically Signed   By: Laveda AbbeJeff  Hu M.D.   On: 07/03/2014 18:35   Dg Abd 1 View  07/03/2014   CLINICAL DATA:  80 year old female with abdominal pain.  EXAM: ABDOMEN - 1 VIEW  COMPARISON:  06/07/2013 radiographs  FINDINGS: The bowel gas pattern is unremarkable.  There is no evidence of bowel obstruction.  No suspicious calcifications are present.  The scoliosis and degenerative changes within the lumbar spine again noted.  No acute bony abnormalities are identified.  IMPRESSION: No evidence of acute abnormality.  Unremarkable bowel gas pattern.   Electronically Signed   By: Laveda AbbeJeff  Hu M.D.   On: 07/03/2014 18:33    Scheduled Meds: . antiseptic oral rinse  7 mL Mouth Rinse q12n4p  . chlorhexidine  15 mL Mouth Rinse BID  . Chlorhexidine Gluconate Cloth  6 each Topical Q0600  . cholecalciferol  1,000 Units Oral Daily  . docusate sodium  100 mg Oral BID  . ferrous sulfate  325 mg Oral Q breakfast  . fluticasone  2 spray Each Nare BID  . folic acid  1 mg Oral Daily  . heparin  5,000 Units Subcutaneous 3 times per day  . latanoprost  1 drop Both Eyes QHS  . multivitamin with minerals  1 tablet Oral Daily  . mupirocin ointment  1 application Nasal BID  . pantoprazole  40 mg Oral Daily  . pneumococcal 23 valent vaccine  0.5 mL  Intramuscular Tomorrow-1000  . simvastatin  5 mg Oral QHS  . sodium chloride  3 mL Intravenous Q12H  . thiamine  100 mg Oral Daily   Continuous Infusions: . dextrose 5 % and 0.2 % NaCl 75 mL/hr at 07/31/14 [id number]    Principal Problem:   ARF (acute renal failure) Active Problems:   Dehydration   Essential hypertension, benign   Renal failure    Time spent: 40 minutes   [facility],Royal [place]irne  [facility] Pager [phone]. If 7PM-7AM, please contact night-coverage at [email or web address], password [email or web address] 07/31/2014, 11:56 AM  LOS: 3 days

## 2014-08-01 ENCOUNTER — Encounter (HOSPITAL_COMMUNITY): Payer: Self-pay | Admitting: *Deleted

## 2014-08-01 LAB — CBC
HCT: 25 % — ABNORMAL LOW (ref 36.0–46.0)
Hemoglobin: 7.4 g/dL — ABNORMAL LOW (ref 12.0–15.0)
MCH: 26.1 pg (ref 26.0–34.0)
MCHC: 29.6 g/dL — ABNORMAL LOW (ref 30.0–36.0)
MCV: 88.3 fL (ref 78.0–100.0)
Platelets: 201 10*3/uL (ref 150–400)
RBC: 2.83 MIL/uL — ABNORMAL LOW (ref 3.87–5.11)
RDW: 21.2 % — AB (ref 11.5–15.5)
WBC: 4.6 10*3/uL (ref 4.0–10.5)

## 2014-08-01 LAB — BASIC METABOLIC PANEL
Anion gap: 9 (ref 5–15)
BUN: 26 mg/dL — AB (ref 6–23)
CHLORIDE: 122 meq/L — AB (ref 96–112)
CO2: 20 mEq/L (ref 19–32)
Calcium: 9.1 mg/dL (ref 8.4–10.5)
Creatinine, Ser: 1.07 mg/dL (ref 0.50–1.10)
GFR, EST AFRICAN AMERICAN: 51 mL/min — AB (ref >90)
GFR, EST NON AFRICAN AMERICAN: 44 mL/min — AB (ref >90)
Glucose, Bld: 111 mg/dL — ABNORMAL HIGH (ref 70–99)
Potassium: 3.1 mEq/L — ABNORMAL LOW (ref 3.7–5.3)
Sodium: 151 mEq/L — ABNORMAL HIGH (ref 137–147)

## 2014-08-01 LAB — ABO/RH: ABO/RH(D): O POS

## 2014-08-01 LAB — PREPARE RBC (CROSSMATCH)

## 2014-08-01 LAB — GLUCOSE, CAPILLARY: Glucose-Capillary: 89 mg/dL (ref 70–99)

## 2014-08-01 MED ORDER — SODIUM CHLORIDE 0.9 % IV SOLN: Freq: Once | INTRAVENOUS | Status: DC

## 2014-08-01 MED ORDER — DIPHENHYDRAMINE HCL 50 MG/ML IJ SOLN
50.0000 mg | Freq: Once | INTRAMUSCULAR | Status: AC
  Administered 2014-08-01 (×2): 50 mg via INTRAVENOUS

## 2014-08-01 MED ORDER — DIPHENHYDRAMINE HCL 50 MG/ML IJ SOLN
25.0000 mg | Freq: Once | INTRAMUSCULAR | Status: AC
  Administered 2014-08-01: 25 mg via INTRAVENOUS
  Filled 2014-08-01: qty 1

## 2014-08-01 MED ORDER — ACETAMINOPHEN 650 MG RE SUPP
650.0000 mg | Freq: Once | RECTAL | Status: AC
  Administered 2014-08-01: 650 mg via RECTAL

## 2014-08-01 MED ORDER — DIPHENHYDRAMINE HCL 50 MG/ML IJ SOLN
INTRAMUSCULAR | Status: AC
  Administered 2014-08-01: 50 mg via INTRAVENOUS
  Filled 2014-08-01: qty 1

## 2014-08-01 MED ORDER — KCL IN DEXTROSE-NACL 40-5-0.45 MEQ/L-%-% IV SOLN
INTRAVENOUS | Status: DC
  Administered 2014-08-01 – 2014-08-02 (×2): via INTRAVENOUS
  Filled 2014-08-01 (×4): qty 1000

## 2014-08-01 MED ORDER — SODIUM CHLORIDE 0.9 % IV SOLN
Freq: Once | INTRAVENOUS | Status: AC
  Administered 2014-08-01: 13:00:00 via INTRAVENOUS

## 2014-08-01 NOTE — Progress Notes (Addendum)
[facility] PROGRESS NOTE  Karla Morgan LKG:[id number] DOB: May 24, 1920 DOA: 07/28/2014 PCP: Florentina Jenny, MD  Assessment/Plan: Principal Problem:   ARF (acute renal failure) Active Problems:   Dehydration   Essential hypertension, benign   Renal failure    1. ARF-prerenal -likely related to dehydration with improving Creatinine Some improvement with IV hydration  2. Dehydration Continue IV fluids -currently on D5 1/4 normal saline  She will remain on IV fluids until by mouth intake improved which is unlikely to happen  3. Essential Hypertension -will continue with home medications  -Pressure is controlled at this time    4. Hypernatremia/hypokalemia/hypoglycemia All related to poor oral intake Continue D51/4 normal saline with 40 of KCl, sodium improving , increase rate to 125 cc per hour  -again overall related to dehydration , poor oral intake in the setting of advanced dementia  Serial BMP today, avoid overcorrection of more than 12 mEq in 24 hours  Palliative care consultation requested because of advanced dementia  Awaiting consultation to determine hospice eligibility   5. Anemia  FOBT positive, significant drop in hemoglobin  could not tolerate the PRBC transfusion and spiked a fever, discussed with Dr Raynald Blend pathologist,she had a febrile transfusion reaction, therefore OK to transfuse another unit after premedication Administer Tylenol and Benadryl Repeat CBC in the morning  Code Status: full  Family Communication: family updated about patient's clinical progress  Disposition Plan: Palliative care consultation pending   Brief narrative:  Karla Morgan is a 80 y.o. female with baseline history of dementia presents as a transfer from [facility] for dehydration. Patient presented there originally with flank pain. It was felt that she had a UTI and was constipated. Patient had nonspecific gas pattern on the abdominal series and had a negative CXR noted. She  did have blood work done and this shows that she has significant hypernatremia and also had worsening of her creatinine. Patient had a urinary evaluation done and was felt to be negative for a UTI. She has no nausea no vomiting no chest pain noted. She did c/o some non-specific abdominal pain however. There is no falls and no recent stroke  Consultants:  Palliative care Procedures:  None Antibiotics:  None HPI/Subjective:  Patient curled up in bed, communicative but confused, does not have an appetite   Objective: Filed Vitals:   07/31/14 2209 08/01/14 0545 08/01/14 1238 08/01/14 1305  BP: 126/76 148/57 93/39 94/42   Pulse: 81 85 89 82  Temp: 98 F (36.7 C) 98 F (36.7 C) 97.8 F (36.6 C) 98.8 F (37.1 C)  TempSrc: Axillary Axillary Axillary Axillary  Resp: Weight:  44.906 kg (99 lb)    SpO2: 93% 95% 96% 95%    Intake/Output Summary (Last 24 hours) at 08/01/14 1406 Last data filed at 08/01/14 1238  Gross per 24 hour  Intake 5246.25 ml  Output      0 ml  Net 5246.25 ml    Exam:  General: alert & oriented x 3 In NAD  Cardiovascular: RRR, nl S1 s2  Respiratory: Decreased breath sounds at the bases, scattered rhonchi, no crackles  Abdomen: soft +BS NT/ND, no masses palpable  Extremities: No cyanosis and no edema      Data Reviewed: Basic Metabolic Panel:  Recent Labs Lab 07/28/14 1618 07/29/14 0439 07/30/14 0828 07/30/14 2141 08/01/14 0522  NA 160* 160* 163* 164* 151*  K 4.0 4.3 4.6 4.1 3.1*  CL 123* 129* >130* >130* 122*  CO2 25  GLUCOSE 106* 91 96 112* 111*  BUN 96* 84* 53* 45* 26*  CREATININE 2.10* 1.66* 1.22* 1.10 1.07  CALCIUM 10.7* 9.8 9.3 9.3 9.1    Liver Function Tests:  Recent Labs Lab 07/28/14 1618 07/29/14 0439 07/30/14 0828  AST 35 35 40*  ALT ALKPHOS 84 76 69  BILITOT 0.5 0.5 0.4  PROT 12.6* 10.8* 9.4*  ALBUMIN 3.5 3.0* 2.5*   No results found for this basename: LIPASE, AMYLASE,  in the last  168 hours No results found for this basename: AMMONIA,  in the last 168 hours  CBC:  Recent Labs Lab 07/28/14 1618 07/29/14 0439 07/30/14 0420 08/01/14 0522  WBC 5.0 4.0 3.3* 4.6  NEUTROABS 2.5  --   --   --   HGB 8.5* 7.8* 8.3* 7.4*  HCT 29.3* 27.4* 29.1* 25.0*  MCV 89.6 87.8 88.7 88.3  PLT 337 315 302 201    Cardiac Enzymes: No results found for this basename: CKTOTAL, CKMB, CKMBINDEX, TROPONINI,  in the last 168 hours BNP (last 3 results) No results found for this basename: PROBNP,  in the last 8760 hours   CBG:  Recent Labs Lab 07/29/14 0852 07/30/14 0802 07/30/14 2141 07/31/14 0757 08/01/14 0732  GLUCAP 71 81 114* 93 89    Recent Results (from the past 240 hour(s))  URINE CULTURE     Status: None   Collection Time    07/28/14  4:36 PM      Result Value Ref Range Status   Specimen Description URINE, CATHETERIZED   Final   Special Requests NONE   Final   Culture  Setup Time     Final   Value: 07/29/2014 12:39     Performed at [facility] Count     Final   Value: NO GROWTH     Performed at [facility]   Culture     Final   Value: NO GROWTH     Performed at [facility]   Report Status 07/30/2014 FINAL   Final  MRSA PCR SCREENING     Status: Abnormal   Collection Time    07/29/14  6:58 AM      Result Value Ref Range Status   MRSA by PCR POSITIVE (*) NEGATIVE Final   Comment:            The GeneXpert MRSA Assay (FDA     approved for NASAL specimens     only), is one component of a     comprehensive MRSA colonization     surveillance program. It is not     intended to diagnose MRSA     infection nor to guide or     monitor treatment for     MRSA infections.     RESULT CALLED TO, READ BACK BY AND VERIFIED WITH:     BUTLER RN 8:20 07/29/14 ([email or web address])     Studies: Dg Chest 2 View  07/28/2014   CLINICAL DATA:  Failure to thrive, decreased oral intake, history of dementia  EXAM: CHEST  2 VIEW  COMPARISON:  Chest  x-ray from right rib series and July 03, 2014  FINDINGS: The lungs are adequately inflated and clear. The heart is top-normal in size. The pulmonary vascularity is not engorged. There is tortuosity of the descending thoracic aorta. There is no pleural effusion. There is mild stable curvature of the thoracolumbar spine.  IMPRESSION: There is no  active cardiopulmonary disease.   Electronically Signed   By: David  [place]   On: 07/28/2014 17:16   Dg Ribs Unilateral W/chest Left  07/03/2014   CLINICAL DATA:  Left chest and rib pain.  EXAM: LEFT RIBS AND CHEST - 3+ VIEW  COMPARISON:  06/07/2013 and prior chest radiographs  FINDINGS: Mild cardiomegaly noted.  Minimal scarring at the lung bases noted.  There is no evidence of focal airspace disease, pulmonary edema, suspicious pulmonary nodule/mass, pleural effusion, or pneumothorax. No acute bony abnormalities are identified.  There is no evidence of left rib abnormality or fracture.  IMPRESSION: Mild cardiomegaly without active cardiopulmonary disease. No evidence of left rib abnormality.   Electronically Signed   By: Laveda Abbe M.D.   On: 07/03/2014 18:35   Dg Abd 1 View  07/03/2014   CLINICAL DATA:  80 year old female with abdominal pain.  EXAM: ABDOMEN - 1 VIEW  COMPARISON:  06/07/2013 radiographs  FINDINGS: The bowel gas pattern is unremarkable.  There is no evidence of bowel obstruction.  No suspicious calcifications are present.  The scoliosis and degenerative changes within the lumbar spine again noted.  No acute bony abnormalities are identified.  IMPRESSION: No evidence of acute abnormality.  Unremarkable bowel gas pattern.   Electronically Signed   By: Laveda Abbe M.D.   On: 07/03/2014 18:33    Scheduled Meds: . Chlorhexidine Gluconate Cloth  6 each Topical Q0600  . cholecalciferol  1,000 Units Oral Daily  . docusate sodium  100 mg Oral BID  . ferrous sulfate  325 mg Oral Q breakfast  . fluticasone  2 spray Each Nare BID  . folic acid  1 mg Oral Daily   . heparin  5,000 Units Subcutaneous 3 times per day  . latanoprost  1 drop Both Eyes QHS  . multivitamin with minerals  1 tablet Oral Daily  . mupirocin ointment  1 application Nasal BID  . pantoprazole  40 mg Oral Daily  . pneumococcal 23 valent vaccine  0.5 mL Intramuscular Tomorrow-1000  . simvastatin  5 mg Oral QHS  . sodium chloride  3 mL Intravenous Q12H  . thiamine  100 mg Oral Daily   Continuous Infusions: . dextrose 5 % and 0.45 % NaCl with KCl 40 mEq/L      Principal Problem:   ARF (acute renal failure) Active Problems:   Dehydration   Essential hypertension, benign   Renal failure    Time spent: 40 minutes   [facility]  [facility] Pager [phone]. If 7PM-7AM, please contact night-coverage at [email or web address], password [facility] 08/01/2014, 2:06 PM  LOS: 4 days

## 2014-08-01 NOTE — Progress Notes (Signed)
Patient has refused medications all night. Crushed meds and put in applesauce but patient spit them out. Also patient took out IV and is refusing to let someone try and replace it.                              Jontay Maston RN

## 2014-08-01 NOTE — Progress Notes (Signed)
Clinical Child psychotherapist (CSW) received call from [employer] stating that patient is a ward of the state and needs consent for a blood transfusion. CSW contacted Crist Fat with Adult Protective Services (APS). Steward Drone reported that she is the patient's guardian. CSW gave RN guardian contact information to obtain consent.   GuardianCrist Fat [phone]  CSW will continue to follow and assist as needed.   Jetta Lout, LCSWA Weekend CSW [phone]

## 2014-08-01 NOTE — Significant Event (Signed)
Pt ordered 1u PRBC's.  Transfusion started at 1250.  VS per chart.  At 1405, temp spiked 102.6 axillary.  Blood transfusion dc'd immediately.  NS started at 1405.  IV benadryl and rectal tylenol given stat.  Pt presently stable.  Will continue to monitor

## 2014-08-01 NOTE — Progress Notes (Signed)
Patient [name]      DOB: 06-28-1920      WUJ:[id number]  Attempted to call HCPOA Crist Fat again today. I have seen that social work able to reach. I attempted to call both home and mobile phone. Still have not received call back. Left another message to return call on PMT phone when she is available.   Orvis Brill D.O. Palliative Medicine Team at [facility]  Pager: [phone] Team Phone: [phone]

## 2014-08-02 ENCOUNTER — Inpatient Hospital Stay (HOSPITAL_COMMUNITY): Payer: Medicare Other

## 2014-08-02 LAB — TYPE AND SCREEN
ABO/RH(D): O POS
Antibody Screen: NEGATIVE
UNIT DIVISION: 0
Unit division: 0

## 2014-08-02 LAB — BASIC METABOLIC PANEL
ANION GAP: 7 (ref 5–15)
BUN: 20 mg/dL (ref 6–23)
CALCIUM: 8.5 mg/dL (ref 8.4–10.5)
CO2: 19 meq/L (ref 19–32)
CREATININE: 1.06 mg/dL (ref 0.50–1.10)
Chloride: 119 mEq/L — ABNORMAL HIGH (ref 96–112)
GFR calc Af Amer: 51 mL/min — ABNORMAL LOW (ref >90)
GFR, EST NON AFRICAN AMERICAN: 44 mL/min — AB (ref >90)
GLUCOSE: 119 mg/dL — AB (ref 70–99)
Potassium: 3.4 mEq/L — ABNORMAL LOW (ref 3.7–5.3)
Sodium: 145 mEq/L (ref 137–147)

## 2014-08-02 LAB — CBC
HCT: 30.5 % — ABNORMAL LOW (ref 36.0–46.0)
Hemoglobin: 9.6 g/dL — ABNORMAL LOW (ref 12.0–15.0)
MCH: 26.2 pg (ref 26.0–34.0)
MCHC: 31.5 g/dL (ref 30.0–36.0)
MCV: 83.1 fL (ref 78.0–100.0)
Platelets: 173 10*3/uL (ref 150–400)
RBC: 3.67 MIL/uL — ABNORMAL LOW (ref 3.87–5.11)
RDW: 20.5 % — AB (ref 11.5–15.5)
WBC: 4.9 10*3/uL (ref 4.0–10.5)

## 2014-08-02 LAB — GLUCOSE, CAPILLARY: Glucose-Capillary: 90 mg/dL (ref 70–99)

## 2014-08-02 LAB — CLOSTRIDIUM DIFFICILE BY PCR: Toxigenic C. Difficile by PCR: POSITIVE — AB

## 2014-08-02 MED ORDER — METRONIDAZOLE 500 MG PO TABS
500.0000 mg | ORAL_TABLET | Freq: Three times a day (TID) | ORAL | Status: DC
  Filled 2014-08-02: qty 1

## 2014-08-02 MED ORDER — KCL IN DEXTROSE-NACL 20-5-0.45 MEQ/L-%-% IV SOLN
INTRAVENOUS | Status: DC
  Administered 2014-08-02: 15:00:00 via INTRAVENOUS
  Administered 2014-08-02: 125 mL/h via INTRAVENOUS
  Filled 2014-08-02 (×7): qty 1000

## 2014-08-02 MED ORDER — ENSURE COMPLETE PO LIQD
237.0000 mL | Freq: Three times a day (TID) | ORAL | Status: DC
  Administered 2014-08-02 – 2014-08-06 (×10): 237 mL via ORAL

## 2014-08-02 NOTE — Progress Notes (Signed)
Patient WU:[id number] Karla Morgan      DOB: [birth year]      NWG:[id number]  Discussed case with care manager for [name].  I have again left a voice message for [name] to return my call.  The Voice mail specifically states that this his her phone number.  Multiple messages left last week by my partner and myself.  Patient with advanced dementia and unable to participate in goals of care.    [name] L. Ladona Ridgel, MD MBA The Palliative Medicine Team at [facility] Phone: [phone] Pager: [phone] ( Use team phone after hours)

## 2014-08-02 NOTE — Progress Notes (Signed)
[facility] PROGRESS NOTE  Karla Morgan ZOX:[id number] DOB: [birth year] DOA: 07/28/2014 PCP: Florentina Jenny, MD  Assessment/Plan: Principal Problem:   ARF (acute renal failure) Active Problems:   Dehydration   Essential hypertension, benign   Renal failure   C. difficile colitis Continue enteric precautions Start the patient on oral Flagyl   1. ARF-prerenal -likely related to dehydration in the setting of C. difficile colitis, with improving Creatinine Some improvement with IV hydration   2. Dehydration Continue IV fluids -currently on D5 1/4 normal saline  She will remain on IV fluids until by mouth intake improved which is unlikely to happen   3. Essential Hypertension -will continue with home medications  -Pressure is controlled at this time   4. Hypernatremia/hypokalemia/hypoglycemia All related to poor oral intake , now has diarrhea Continue D51/4 normal saline with 40 of KCl, sodium improving , increase rate to 125 cc per hour  -again overall related to dehydration , poor oral intake in the setting of advanced dementia  Serial BMP today, avoid overcorrection of more than 12 mEq in 24 hours    5. Anemia  FOBT positive, significant drop in hemoglobin  could not tolerate the PRBC transfusion and spiked a fever, discussed with Dr Raynald Blend pathologist, patient had a febrile transfusion reaction, therefore transfused a second unit after premedication the patient with Tylenol and Benadryl Hemoglobin 9.6 today   6. fever could be secondary to febrile transfusion reaction versus C. difficile colitis Initiated the patient on Flagyl We'll also obtain a portable chest x-ray and a UA, although the patient will not do well if initiated on an antibiotic given C. difficile colitis   Code Status: full  Family Communication: family updated about patient's clinical progress  Disposition Plan: Palliative care consultation pending   Brief narrative:  Karla Morgan is a  80 y.o. female with baseline history of dementia presents as a transfer from [facility] for dehydration. Patient presented there originally with flank pain. It was felt that she had a UTI and was constipated. Patient had nonspecific gas pattern on the abdominal series and had a negative CXR noted. She did have blood work done and this shows that she has significant hypernatremia and also had worsening of her creatinine. Patient had a urinary evaluation done and was felt to be negative for a UTI. She has no nausea no vomiting no chest pain noted. She did c/o some non-specific abdominal pain however. There is no falls and no recent stroke  Consultants:  Palliative care Procedures:  None Antibiotics:  None  HPI/Subjective: Apparently had multiple bowel movements overnight  Objective: Filed Vitals:   08/01/14 2250 08/01/14 2300 08/02/14 0500 08/02/14 1306  BP: 120/41 150/58 121/54 123/52  Pulse: 102 100 91 80  Temp: 100.2 F (37.9 C) 99 F (37.2 C) 99 F (37.2 C) 99 F (37.2 C)  TempSrc: Axillary Axillary Axillary Axillary  Resp: Weight:   49.896 kg (110 lb)   SpO2: 93% 93% 93% 89%    Intake/Output Summary (Last 24 hours) at 08/02/14 1403 Last data filed at 08/02/14 0900  Gross per 24 hour  Intake  498.5 ml  Output      0 ml  Net  498.5 ml    Exam:  General: alert & oriented x self In NAD  Cardiovascular: RRR, nl S1 s2  Respiratory: Decreased breath sounds at the bases, scattered rhonchi, no crackles  Abdomen: soft +BS NT/ND, no masses palpable  Extremities: No cyanosis  and no edema      Data Reviewed: Basic Metabolic Panel:  Recent Labs Lab 07/29/14 0439 07/30/14 0828 07/30/14 2141 08/01/14 0522 08/02/14 0530  NA 160* 163* 164* 151* 145  K 4.3 4.6 4.1 3.1* 3.4*  CL 129* >130* >130* 122* 119*  CO2 GLUCOSE 91 96 112* 111* 119*  BUN 84* 53* 45* 26* 20  CREATININE 1.66* 1.22* 1.10 1.07 1.06  CALCIUM 9.8 9.3 9.3 9.1 8.5    Liver  Function Tests:  Recent Labs Lab 07/28/14 1618 07/29/14 0439 07/30/14 0828  AST 35 35 40*  ALT ALKPHOS 84 76 69  BILITOT 0.5 0.5 0.4  PROT 12.6* 10.8* 9.4*  ALBUMIN 3.5 3.0* 2.5*   No results found for this basename: LIPASE, AMYLASE,  in the last 168 hours No results found for this basename: AMMONIA,  in the last 168 hours  CBC:  Recent Labs Lab 07/28/14 1618 07/29/14 0439 07/30/14 0420 08/01/14 0522 08/02/14 0530  WBC 5.0 4.0 3.3* 4.6 4.9  NEUTROABS 2.5  --   --   --   --   HGB 8.5* 7.8* 8.3* 7.4* 9.6*  HCT 29.3* 27.4* 29.1* 25.0* 30.5*  MCV 89.6 87.8 88.7 88.3 83.1  PLT 337 315 302 201 173    Cardiac Enzymes: No results found for this basename: CKTOTAL, CKMB, CKMBINDEX, TROPONINI,  in the last 168 hours BNP (last 3 results) No results found for this basename: PROBNP,  in the last 8760 hours   CBG:  Recent Labs Lab 07/30/14 0802 07/30/14 2141 07/31/14 0757 08/01/14 0732 08/02/14 0808  GLUCAP 81 114* 93 89 90    Recent Results (from the past 240 hour(s))  URINE CULTURE     Status: None   Collection Time    07/28/14  4:36 PM      Result Value Ref Range Status   Specimen Description URINE, CATHETERIZED   Final   Special Requests NONE   Final   Culture  Setup Time     Final   Value: 07/29/2014 12:39     Performed at [facility] Count     Final   Value: NO GROWTH     Performed at [facility]   Culture     Final   Value: NO GROWTH     Performed at [facility]   Report Status 07/30/2014 FINAL   Final  MRSA PCR SCREENING     Status: Abnormal   Collection Time    07/29/14  6:58 AM      Result Value Ref Range Status   MRSA by PCR POSITIVE (*) NEGATIVE Final   Comment:            The GeneXpert MRSA Assay (FDA     approved for NASAL specimens     only), is one component of a     comprehensive MRSA colonization     surveillance program. It is not     intended to diagnose MRSA     infection nor to guide  or     monitor treatment for     MRSA infections.     RESULT CALLED TO, READ BACK BY AND VERIFIED WITH:     BUTLER RN 8:20 07/29/14 ([email or web address])  CLOSTRIDIUM DIFFICILE BY PCR     Status: Abnormal   Collection Time    08/02/14  9:29 AM      Result Value Ref Range  Status   C difficile by pcr POSITIVE (*) NEGATIVE Final   Comment: CRITICAL RESULT CALLED TO, READ BACK BY AND VERIFIED WITH:     SIFON RN 12:05 08/02/14 ([email or web address])     Studies: Dg Chest 2 View  07/28/2014   CLINICAL DATA:  Failure to thrive, decreased oral intake, history of dementia  EXAM: CHEST  2 VIEW  COMPARISON:  Chest x-ray from right rib series and July 03, 2014  FINDINGS: The lungs are adequately inflated and clear. The heart is top-normal in size. The pulmonary vascularity is not engorged. There is tortuosity of the descending thoracic aorta. There is no pleural effusion. There is mild stable curvature of the thoracolumbar spine.  IMPRESSION: There is no active cardiopulmonary disease.   Electronically Signed   By: David  [place]   On: 07/28/2014 17:16   Dg Ribs Unilateral W/chest Left  07/03/2014   CLINICAL DATA:  Left chest and rib pain.  EXAM: LEFT RIBS AND CHEST - 3+ VIEW  COMPARISON:  06/07/2013 and prior chest radiographs  FINDINGS: Mild cardiomegaly noted.  Minimal scarring at the lung bases noted.  There is no evidence of focal airspace disease, pulmonary edema, suspicious pulmonary nodule/mass, pleural effusion, or pneumothorax. No acute bony abnormalities are identified.  There is no evidence of left rib abnormality or fracture.  IMPRESSION: Mild cardiomegaly without active cardiopulmonary disease. No evidence of left rib abnormality.   Electronically Signed   By: Laveda Abbe M.D.   On: 07/03/2014 18:35   Dg Abd 1 View  07/03/2014   CLINICAL DATA:  80 year old female with abdominal pain.  EXAM: ABDOMEN - 1 VIEW  COMPARISON:  06/07/2013 radiographs  FINDINGS: The bowel gas pattern is unremarkable.  There is no evidence of bowel  obstruction.  No suspicious calcifications are present.  The scoliosis and degenerative changes within the lumbar spine again noted.  No acute bony abnormalities are identified.  IMPRESSION: No evidence of acute abnormality.  Unremarkable bowel gas pattern.   Electronically Signed   By: Laveda Abbe M.D.   On: 07/03/2014 18:33   Dg Chest Port 1 View  08/02/2014   CLINICAL DATA:  80 year old female with fever. Initial encounter.  EXAM: PORTABLE CHEST - 1 VIEW  COMPARISON:  07/28/2014 and earlier.  FINDINGS: Portable AP semi upright view at 1258 hrs. Mildly lower lung volumes. Stable cardiomegaly and mediastinal contours. Visualized tracheal air column is within normal limits. Allowing for portable technique, the lungs are clear. No pneumothorax or pleural effusion identified.  IMPRESSION: No acute cardiopulmonary abnormality.   Electronically Signed   By: Augusto Gamble M.D.   On: 08/02/2014 13:08    Scheduled Meds: . sodium chloride   Intravenous Once  . Chlorhexidine Gluconate Cloth  6 each Topical Q0600  . cholecalciferol  1,000 Units Oral Daily  . docusate sodium  100 mg Oral BID  . ferrous sulfate  325 mg Oral Q breakfast  . fluticasone  2 spray Each Nare BID  . folic acid  1 mg Oral Daily  . heparin  5,000 Units Subcutaneous 3 times per day  . latanoprost  1 drop Both Eyes QHS  . metroNIDAZOLE  500 mg Oral 3 times per day  . multivitamin with minerals  1 tablet Oral Daily  . mupirocin ointment  1 application Nasal BID  . pantoprazole  40 mg Oral Daily  . pneumococcal 23 valent vaccine  0.5 mL Intramuscular Tomorrow-1000  . simvastatin  5 mg Oral QHS  . sodium chloride  3 mL Intravenous Q12H  . thiamine  100 mg Oral Daily   Continuous Infusions: . dextrose 5 % and 0.45 % NaCl with KCl 40 mEq/L 100 mL/hr at 08/02/14 5188    Principal Problem:   ARF (acute renal failure) Active Problems:   Dehydration   Essential hypertension, benign   Renal failure    Time spent: 40  minutes   [facility]  [facility] Pager [phone]. If 7PM-7AM, please contact night-coverage at [email or web address], password [facility] 08/02/2014, 2:03 PM  LOS: 5 days

## 2014-08-02 NOTE — Progress Notes (Signed)
INITIAL NUTRITION ASSESSMENT  DOCUMENTATION CODES Per approved criteria  -Severe malnutrition in the context of chronic illness   Pt meets criteria for severe MALNUTRITION in the context of chronic illness as evidenced by severe muscle and subcutaneous fat depletion.  INTERVENTION:  Provide Ensure Complete po TID, each supplement provides 350 kcal and 13 grams of protein.    If TF desired, recommend Jevity 1.2 formula with a goal rate of 50 ml/hr, providing 1200 kcal (92% of estimated needs), 67g Protein (100% of estimated protein needs), and 968 ml of free water.    Will monitor for changes in goals of care.   NUTRITION DIAGNOSIS: Inadequate oral intake related to advanced dementia as evidenced by poor PO intake of <25% of meals.   Goal: Pt to meet >/= 90% of their estimated nutrition needs   Monitor:  Goals of care, PO intake, Supplement acceptance, Weight, labs, I/O's  Reason for Assessment: Pt identified as at nutrition risk on the Malnutrition Screen Tool   Admitting Dx: ARF (acute renal failure)  ASSESSMENT: 80 y.o. female with baseline history of dementia presents as a transfer from [facility] for dehydration. Patient presented there originally with flank pain. It was felt that she had a UTI and was constipated. She has significant hypernatremia and also had worsening of her creatinine.   Unable to speak with pt, pt nonverbal d/t advanced dementia, unable to obtain UBW, % wt loss, no weight history documented.  Per documentation, PO intake: 10% this AM. Currently on dysphagia 1 diet with thin liquids. Per documentation, pt is missing teeth and has experienced poor oral intake. Hospice care consult pending. Per case manager, physical guardian is trying to be contacted.   Nutrition Focused Physical Exam:  Subcutaneous Fat:  Orbital Region: WNL Upper Arm Region: severe depletion Thoracic and Lumbar Region: severe depletion  Muscle:  Temple Region: mild/moderate  depletion Clavicle Bone Region: severe depletion Clavicle and Acromion Bone Region: severe depletion Scapular Bone Region: severe depletion Dorsal Hand: N/A Patellar Region: severe depletion Anterior Thigh Region: severe depletion Posterior Calf Region: severe depletion  Edema: not documented   Labs reviewed: Glucose 119, Low Potassium  Height: Ht Readings from Last 1 Encounters:  No data found for Ht    Weight: Wt Readings from Last 1 Encounters:  08/02/14 110 lb (49.896 kg)    Ideal Body Weight: unable to calculate, missing ht measurement  % Ideal Body Weight: N/A  Wt Readings from Last 10 Encounters:  08/02/14 110 lb (49.896 kg)  8/20 Admit       83 lb  Usual Body Weight: unable to obtain  % Usual Body Weight: N/A  BMI:  There is no height on file to calculate BMI.  Estimated Nutritional Needs: Kcal: 1300-1500 Protein: 60-70g Fluid: 1.5 L/day  Skin: intact  Diet Order: Dysphagia  EDUCATION NEEDS: -No education needs identified at this time   Intake/Output Summary (Last 24 hours) at 08/02/14 1115 Last data filed at 08/02/14 0900  Gross per 24 hour  Intake  748.5 ml  Output      0 ml  Net  748.5 ml    Last BM: 8/23   Labs:   Recent Labs Lab 07/30/14 2141 08/01/14 0522 08/02/14 0530  NA 164* 151* 145  K 4.1 3.1* 3.4*  CL >130* 122* 119*  CO2 BUN 45* 26* 20  CREATININE 1.10 1.07 1.06  CALCIUM 9.3 9.1 8.5  GLUCOSE 112* 111* 119*    CBG (last 3)  Recent Labs  07/31/14 0757 08/01/14 0732 08/02/14 0808  GLUCAP 93 89 90    Scheduled Meds: . sodium chloride   Intravenous Once  . Chlorhexidine Gluconate Cloth  6 each Topical Q0600  . cholecalciferol  1,000 Units Oral Daily  . docusate sodium  100 mg Oral BID  . ferrous sulfate  325 mg Oral Q breakfast  . fluticasone  2 spray Each Nare BID  . folic acid  1 mg Oral Daily  . heparin  5,000 Units Subcutaneous 3 times per day  . latanoprost  1 drop Both Eyes QHS  .  multivitamin with minerals  1 tablet Oral Daily  . mupirocin ointment  1 application Nasal BID  . pantoprazole  40 mg Oral Daily  . pneumococcal 23 valent vaccine  0.5 mL Intramuscular Tomorrow-1000  . simvastatin  5 mg Oral QHS  . sodium chloride  3 mL Intravenous Q12H  . thiamine  100 mg Oral Daily    Continuous Infusions: . dextrose 5 % and 0.45 % NaCl with KCl 40 mEq/L 100 mL/hr at 08/02/14 9604    Past Medical History  Diagnosis Date  . Dementia   . Acid reflux   . High cholesterol   . Hypertension   . Arthritis   . Anxiety   . Glaucoma   . Constipation   . Vitamin D deficiency   . CAP (community acquired pneumonia)   . Alzheimer's dementia     History reviewed. No pertinent past surgical history.  Tilda Franco, MS, PLDN Provisionally Licensed Dietitian Nutritionist Pager: [phone]

## 2014-08-02 NOTE — Progress Notes (Signed)
Note/chart reviewed.  Katie Tailer Volkert, RD, LDN Pager #: [phone] After-Hours Pager #: [phone]  

## 2014-08-02 NOTE — Progress Notes (Signed)
Speech Language Pathology Treatment: Dysphagia  Patient Details Name: Karla Morgan MRN: [id number] DOB: March 23, 1920 Today's Date: 08/02/2014 Time: 1550-1606 SLP Time Calculation (min): 16 min  Assessment / Plan / Recommendation Clinical Impression  Pt continues to require Max multimodal cueing from SLP to facilitate oral acceptance, manipulation, and transit of pureed boluses, with minimal intake. She again declined thin liquids offered. Pt presented with an intermittent cough, although CXR today did not reveal signs of PNA. Given dementia, it is likely that pt's aspiration risks fluctuates throughout the day depending on her alertness and attentiveness to task. Will continue to monitor for tolerance.   HPI HPI: Karla Morgan is a 80 y.o. female with baseline history of dementia who presents as a transfer from [facility] for dehydration and acute renal failure. PMH also significant for acid reflux.   Pertinent Vitals Pain Assessment: Faces Faces Pain Scale: No hurt  SLP Plan  Continue with current plan of care    Recommendations Diet recommendations: Dysphagia 1 (puree);Thin liquid Liquids provided via: Cup;No straw Medication Administration: Crushed with puree Supervision: Patient able to self feed;Full supervision/cueing for compensatory strategies Compensations: Slow rate;Small sips/bites (check for oral holding) Postural Changes and/or Swallow Maneuvers: Seated upright 90 degrees;Upright 30-60 min after meal              Oral Care Recommendations: Oral care BID Follow up Recommendations: Skilled Nursing facility;24 hour supervision/assistance Plan: Continue with current plan of care    GO      Maxcine Ham, M.A. CCC-SLP [phone]  Maxcine Ham 08/02/2014, 4:13 PM

## 2014-08-03 DIAGNOSIS — Z515 Encounter for palliative care: Secondary | ICD-10-CM

## 2014-08-03 DIAGNOSIS — R627 Adult failure to thrive: Secondary | ICD-10-CM

## 2014-08-03 DIAGNOSIS — A0472 Enterocolitis due to Clostridium difficile, not specified as recurrent: Secondary | ICD-10-CM

## 2014-08-03 DIAGNOSIS — E43 Unspecified severe protein-calorie malnutrition: Secondary | ICD-10-CM | POA: Insufficient documentation

## 2014-08-03 LAB — GLUCOSE, CAPILLARY
Glucose-Capillary: 46 mg/dL — ABNORMAL LOW (ref 70–99)
Glucose-Capillary: 39 mg/dL — CL (ref 70–99)
Glucose-Capillary: 72 mg/dL (ref 70–99)

## 2014-08-03 LAB — BASIC METABOLIC PANEL
Anion gap: 9 (ref 5–15)
BUN: 13 mg/dL (ref 6–23)
CO2: 18 meq/L — AB (ref 19–32)
Calcium: 8.6 mg/dL (ref 8.4–10.5)
Chloride: 114 mEq/L — ABNORMAL HIGH (ref 96–112)
Creatinine, Ser: 0.91 mg/dL (ref 0.50–1.10)
GFR calc Af Amer: 62 mL/min — ABNORMAL LOW (ref >90)
GFR calc non Af Amer: 53 mL/min — ABNORMAL LOW (ref >90)
GLUCOSE: 106 mg/dL — AB (ref 70–99)
POTASSIUM: 3.7 meq/L (ref 3.7–5.3)
Sodium: 141 mEq/L (ref 137–147)

## 2014-08-03 LAB — CBC
HCT: 30.3 % — ABNORMAL LOW (ref 36.0–46.0)
Hemoglobin: 9.6 g/dL — ABNORMAL LOW (ref 12.0–15.0)
MCH: 25.9 pg — AB (ref 26.0–34.0)
MCHC: 31.7 g/dL (ref 30.0–36.0)
MCV: 81.9 fL (ref 78.0–100.0)
Platelets: 146 10*3/uL — ABNORMAL LOW (ref 150–400)
RBC: 3.7 MIL/uL — AB (ref 3.87–5.11)
RDW: 20.2 % — ABNORMAL HIGH (ref 11.5–15.5)
WBC: 3.8 10*3/uL — ABNORMAL LOW (ref 4.0–10.5)

## 2014-08-03 MED ORDER — DEXTROSE 50 % IV SOLN
50.0000 mL | Freq: Once | INTRAVENOUS | Status: AC | PRN
  Administered 2014-08-03: 50 mL via INTRAVENOUS

## 2014-08-03 MED ORDER — HALOPERIDOL LACTATE 2 MG/ML PO CONC
0.5000 mg | Freq: Four times a day (QID) | ORAL | Status: DC | PRN
  Filled 2014-08-03: qty 0.3

## 2014-08-03 MED ORDER — DEXTROSE 50 % IV SOLN
INTRAVENOUS | Status: AC
  Administered 2014-08-03: 09:00:00
  Filled 2014-08-03: qty 50

## 2014-08-03 MED ORDER — MORPHINE SULFATE (CONCENTRATE) 10 MG /0.5 ML PO SOLN: 5.0000 mg | ORAL | Status: DC | PRN

## 2014-08-03 MED ORDER — METRONIDAZOLE IN NACL 5-0.79 MG/ML-% IV SOLN
500.0000 mg | Freq: Three times a day (TID) | INTRAVENOUS | Status: DC
  Administered 2014-08-03 (×2): 500 mg via INTRAVENOUS
  Filled 2014-08-03 (×4): qty 100

## 2014-08-03 MED ORDER — LORAZEPAM 2 MG/ML IJ SOLN: 0.5000 mg | INTRAMUSCULAR | Status: DC | PRN

## 2014-08-03 NOTE — Progress Notes (Signed)
Patient Karla Morgan      DOB: 02-07-1920      JYN:[id number]  Again No return call from Ms. Fayrene Fearing.  I would ask social work to please investigate this guardian as she is not acting in the best interest of her charge by not returning calls.  Dreshon Proffit L. Ladona Ridgel, MD MBA The Palliative Medicine Team at [facility] Phone: [phone] Pager: [phone] ( Use team phone after hours)

## 2014-08-03 NOTE — Progress Notes (Signed)
[facility] hospital[facility] notified that pt is refusing all medication and not receiving her po Flagyl new orders were received. Ilean Skill LPN

## 2014-08-03 NOTE — Progress Notes (Signed)
Pt. CBG at 0849H is 39.  Initiated hypoglycemic protocol and recheck CBG.  CBG at 0940H is 72.  Pt. Alert and still combative when touched.  No distress noted.

## 2014-08-03 NOTE — Consult Note (Signed)
Patient [name]      DOB: Aug 06, 1920      WUJ:[id number]     Consult Note from the Palliative Medicine Team at [place] Requested by: Dr. Lenard Morgan al.    PCP: Karla Poll, MD Reason for Consultation: GOC, related symptom    Phone [phone] managment Assessment of patients Current state: 80 yr old african [place] female who is unable to give any past medical history due to her dementia. Was admitted with dehydration, flank pain and was treated for UTI, course is now complicated by Cdiff colitis.  She was anemic and her guardian approved blood transfusion. After multiple attempts to contact her guardian, we have been able to update Karla Morgan and I was able to review the limited options for Karla Morgan.  Karla Morgan feels that this is an appropriate time to focus on comfort . She approved change to DNR, discontinuation of abx, offer food but don't force food, and treat pain or other end of life symptoms.  Karla Morgan met with the patient's Attorney on Friday and they decided on a cremation plan for Karla Morgan and agreed that this was likely the path that would need to be taken.   Goals of Care: 1.  Code Status: Change to DNR  2. Scope of Treatment: Discontinue non essential medications including antibiotics.  Treat symptoms only.  Guardian request transition back to [place] with Hospice if possible, if not [facility] home would be requested.   4. Disposition: Updated SW Karla Morgan on need to check with [facility] vs transition to [facility] home.   3. Symptom Management:   1. Anxiety/Agitation : prn ativan and or haldol can be used . Patient in general is ok if not provoked 2. Pain:Roxanol 5 mg q 4 hours prn 3. Bowel Regimen: stop senna if having diarrhea with cdiff. 4. Delirium: secondary to dementia and infection. Prn ativan or haldol if needed. 5. Fever: tylenol prn   4. Psychosocial: Sadly unknown.   Patient unable to tell us.  5. Spiritual: chaplain will be alert to change in status, compassionate companions if available.    Patient Documents Completed or Given: Document Given Completed  Advanced Directives Pkt    MOST    DNR    Gone from My Sight    Hard Choices      Brief HPI: 80 yr old african [place] female with advanced dementia , admitted with dehydration, FTT, and flank pain. We were asked to assist with goals of care.   ROS: unable to provide due to dementia    PMH:  Past Medical History  Diagnosis Date  . Dementia   . Acid reflux   . High cholesterol   . Hypertension   . Arthritis   . Anxiety   . Glaucoma   . Constipation   . Vitamin D deficiency   . CAP (community acquired pneumonia)   . Alzheimer's dementia      NFA:OZHYQMV reviewed. No pertinent past surgical history. I have reviewed the [place] and SH and  If appropriate update it with new information. No Known Allergies Scheduled Meds: . cholecalciferol  1,000 Units Oral Daily  . feeding supplement (ENSURE COMPLETE)  237 mL Oral TID WC  . ferrous sulfate  325 mg Oral Q breakfast  . fluticasone  2 spray Each Nare BID  . folic acid  1 mg Oral Daily  . heparin  5,000 Units Subcutaneous 3 times per  day  . latanoprost  1 drop Both Eyes QHS  . metronidazole  500 mg Intravenous Q8H  . multivitamin with minerals  1 tablet Oral Daily  . mupirocin ointment  1 application Nasal BID  . pantoprazole  40 mg Oral Daily  . pneumococcal 23 valent vaccine  0.5 mL Intramuscular Tomorrow-1000  . simvastatin  5 mg Oral QHS  . sodium chloride  3 mL Intravenous Q12H  . thiamine  100 mg Oral Daily   Continuous Infusions: . dextrose 5 % and 0.45 % NaCl with KCl 20 mEq/L 125 mL/hr (08/02/14 2259)   PRN Meds:.acetaminophen, acetaminophen, ondansetron (ZOFRAN) IV, ondansetron, ondansetron, oxyCODONE, senna    BP 128/68  Pulse 82  Temp(Src) 98.8 F (37.1 C) (Axillary)  Resp 16  Wt 49.896 kg (110 lb)  SpO2 93%    PPS: 10%   Intake/Output Summary (Last 24 hours) at 08/03/14 1557 Last data filed at 08/03/14 1316  Gross per 24 hour  Intake 2096.67 ml  Output      0 ml  Net 2096.67 ml   MIW:OEHOZ soft small stools  Physical Exam:  General:  Karla Morgan opens eyes to loud verbal stimuli, she will startle and call out if touched but if called she will usually be pleasant.  She is lying in fetal position in no acute distress HEENT:   PERRL, EOMI, anicteric, mm dry endentulous Chest:   Decreased with poor air entry, no RRW CVS: regular, distant, s1, s2  Abdomen:abdomen soft, no guarding or rebound Ext: cool, she doesn't like to be touched Neuro:pleasantly confused but she can be very feisty  Labs: CBC    Component Value Date/Time   WBC 3.8* 08/03/2014 0645   RBC 3.70* 08/03/2014 0645   RBC 3.44* 05/05/2010 0533   HGB 9.6* 08/03/2014 0645   HCT 30.3* 08/03/2014 0645   PLT 146* 08/03/2014 0645   MCV 81.9 08/03/2014 0645   MCH 25.9* 08/03/2014 0645   MCHC 31.7 08/03/2014 0645   RDW 20.2* 08/03/2014 0645   LYMPHSABS 2.1 07/28/2014 1618   MONOABS 0.3 07/28/2014 1618   EOSABS 0.1 07/28/2014 1618   BASOSABS 0.0 07/28/2014 1618     CMP     Component Value Date/Time   NA 141 08/03/2014 0645   K 3.7 08/03/2014 0645   CL 114* 08/03/2014 0645   CO2 18* 08/03/2014 0645   GLUCOSE 106* 08/03/2014 0645   BUN 13 08/03/2014 0645   CREATININE 0.91 08/03/2014 0645   CALCIUM 8.6 08/03/2014 0645   PROT 9.4* 07/30/2014 0828   ALBUMIN 2.5* 07/30/2014 0828   AST 40* 07/30/2014 0828   ALT 19 07/30/2014 0828   ALKPHOS 69 07/30/2014 0828   BILITOT 0.4 07/30/2014 0828   GFRNONAA 53* 08/03/2014 0645   GFRAA 62* 08/03/2014 0645    Chest Xray Reviewed/Impressions: No acute cardiopulmonary abnormality.      Time In Time Out Total Time Spent with Patient Total Overall Time  330 pm/450 400/505 pm 15 min 30 min    Greater than 50%  of this time was spent counseling and coordinating care related to the above assessment and  plan.  Glenda Spelman L. Lovena Le, MD MBA The Palliative Medicine Team at [facility] Phone: [phone] Pager: [phone] ( Use team phone after hours)

## 2014-08-03 NOTE — Progress Notes (Signed)
CSW spoke with Ms. Karla Morgan (guardian of the person) who can be reached at [phone]. Ms. Karla Morgan is available this afternoon to discuss Karla Morgan's care.  CSW notified Palliative Care.  Karla Cool, LCSW Assistant Director Clinical Social Work [phone]

## 2014-08-03 NOTE — Progress Notes (Signed)
[facility] PROGRESS NOTE  AHNNA DUNGAN Morgan:[id number] DOB: 1920/05/08 DOA: 07/28/2014 PCP: Florentina Jenny, MD  Assessment/Plan: Principal Problem:   ARF (acute renal failure) Active Problems:   Dehydration   Essential hypertension, benign   Renal failure   Protein-calorie malnutrition, severe    C. difficile colitis  Continue enteric precautions  Refused by mouth Flagyl Switch to IV Flagyl   1. ARF-prerenal -likely related to dehydration in the setting of C. difficile colitis, with improving Creatinine Some improvement with IV hydration  2. Dehydration Continue IV fluids -currently on D5 1/4 normal saline with potassium She will remain on IV fluids until by mouth intake improved which is unlikely to happen  3. Essential Hypertension -will continue with home medications  -Pressure is controlled at this time   4. Hypernatremia/hypokalemia/hypoglycemia All related to poor oral intake , now has diarrhea  Continue D51/4 normal saline with 40 of KCl, sodium improving , increase rate to 125 cc per hour  -again overall related to dehydration , poor oral intake in the setting of advanced dementia  Serial BMP today, avoid overcorrection of more than 12 mEq in 24 hours   5. Anemia  FOBT positive, significant drop in hemoglobin  could not tolerate the PRBC transfusion and spiked a fever, discussed with Dr Raynald Blend pathologist, patient had a febrile transfusion reaction, therefore transfused a second unit after premedication the patient with Tylenol and Benadryl  Hemoglobin stable for the last couple of days Will not repeat   6. fever could be secondary to febrile transfusion reaction versus C. difficile colitis  Initiated the patient on Flagyl  Chest x-ray negative,  High risk aspiration   Code Status: full  Family Communication: family updated about patient's clinical progress  Disposition Plan: Palliative care consultation pending   Brief narrative:  Karla Morgan is a 80 y.o. female with baseline history of dementia presents as a transfer from [facility] for dehydration. Patient presented there originally with flank pain. It was felt that she had a UTI and was constipated. Patient had nonspecific gas pattern on the abdominal series and had a negative CXR noted. She did have blood work done and this shows that she has significant hypernatremia and also had worsening of her creatinine. Patient has primarily been treated for hypernatremia, hyperglycemia, hyperkalemia, acute renal failure, blood loss anemia and now C. difficile colitis, she is dependent on IV hydration to stay alive. Palliative care on board and trying to get a hold of the guardian. She is a ward of the state.  Consultants:  Palliative care Procedures:  None Antibiotics:  None   HPI/Subjective: Multiple bowel movements overnight  Objective: Filed Vitals:   08/02/14 0500 08/02/14 1306 08/02/14 2050 08/03/14 0455  BP: 121/54 123/52 132/55 128/68  Pulse: 91 80 86 82  Temp: 99 F (37.2 C) 99 F (37.2 C) 99.1 F (37.3 C) 98.8 F (37.1 C)  TempSrc: Axillary Axillary Axillary Axillary  Resp: Weight: 49.896 kg (110 lb)     SpO2: 93% 89% 91% 93%    Intake/Output Summary (Last 24 hours) at 08/03/14 1519 Last data filed at 08/03/14 1316  Gross per 24 hour  Intake 2096.67 ml  Output      0 ml  Net 2096.67 ml    Exam:  General: alert & oriented x 3 In NAD  Cardiovascular: RRR, nl S1 s2  Respiratory: Decreased breath sounds at the bases, scattered rhonchi, no crackles  Abdomen: soft +BS NT/ND, no  masses palpable  Extremities: No cyanosis and no edema      Data Reviewed: Basic Metabolic Panel:  Recent Labs Lab 07/30/14 0828 07/30/14 2141 08/01/14 0522 08/02/14 0530 08/03/14 0645  NA 163* 164* 151* 145 141  K 4.6 4.1 3.1* 3.4* 3.7  CL >130* >130* 122* 119* 114*  CO2 18*  GLUCOSE 96 112* 111* 119* 106*  BUN 53* 45* 26* 20 13  CREATININE  1.22* 1.10 1.07 1.06 0.91  CALCIUM 9.3 9.3 9.1 8.5 8.6    Liver Function Tests:  Recent Labs Lab 07/28/14 1618 07/29/14 0439 07/30/14 0828  AST 35 35 40*  ALT ALKPHOS 84 76 69  BILITOT 0.5 0.5 0.4  PROT 12.6* 10.8* 9.4*  ALBUMIN 3.5 3.0* 2.5*   No results found for this basename: LIPASE, AMYLASE,  in the last 168 hours No results found for this basename: AMMONIA,  in the last 168 hours  CBC:  Recent Labs Lab 07/28/14 1618 07/29/14 0439 07/30/14 0420 08/01/14 0522 08/02/14 0530 08/03/14 0645  WBC 5.0 4.0 3.3* 4.6 4.9 3.8*  NEUTROABS 2.5  --   --   --   --   --   HGB 8.5* 7.8* 8.3* 7.4* 9.6* 9.6*  HCT 29.3* 27.4* 29.1* 25.0* 30.5* 30.3*  MCV 89.6 87.8 88.7 88.3 83.1 81.9  PLT 337 315 302 201 173 146*    Cardiac Enzymes: No results found for this basename: CKTOTAL, CKMB, CKMBINDEX, TROPONINI,  in the last 168 hours BNP (last 3 results) No results found for this basename: PROBNP,  in the last 8760 hours   CBG:  Recent Labs Lab 08/01/14 0732 08/02/14 0808 08/03/14 0802 08/03/14 0849 08/03/14 0940  GLUCAP 89 90 46* 39* 72    Recent Results (from the past 240 hour(s))  URINE CULTURE     Status: None   Collection Time    07/28/14  4:36 PM      Result Value Ref Range Status   Specimen Description URINE, CATHETERIZED   Final   Special Requests NONE   Final   Culture  Setup Time     Final   Value: 07/29/2014 12:39     Performed at [facility] Count     Final   Value: NO GROWTH     Performed at [facility]   Culture     Final   Value: NO GROWTH     Performed at [facility]   Report Status 07/30/2014 FINAL   Final  MRSA PCR SCREENING     Status: Abnormal   Collection Time    07/29/14  6:58 AM      Result Value Ref Range Status   MRSA by PCR POSITIVE (*) NEGATIVE Final   Comment:            The GeneXpert MRSA Assay (FDA     approved for NASAL specimens     only), is one component of a     comprehensive  MRSA colonization     surveillance program. It is not     intended to diagnose MRSA     infection nor to guide or     monitor treatment for     MRSA infections.     RESULT CALLED TO, READ BACK BY AND VERIFIED WITH:     BUTLER RN 8:20 07/29/14 ([email or web address])  CLOSTRIDIUM DIFFICILE BY PCR     Status: Abnormal   Collection Time  08/02/14  9:29 AM      Result Value Ref Range Status   C difficile by pcr POSITIVE (*) NEGATIVE Final   Comment: CRITICAL RESULT CALLED TO, READ BACK BY AND VERIFIED WITH:     SIFON RN 12:05 08/02/14 ([email or web address])  CULTURE, BLOOD (ROUTINE X 2)     Status: None   Collection Time    08/02/14  2:10 PM      Result Value Ref Range Status   Specimen Description BLOOD ARM LEFT   Final   Special Requests BOTTLES DRAWN AEROBIC ONLY 8CC   Final   Culture  Setup Time     Final   Value: 08/02/2014 20:06     Performed at [facility]   Culture     Final   Value:        BLOOD CULTURE RECEIVED NO GROWTH TO DATE CULTURE WILL BE HELD FOR 5 DAYS BEFORE ISSUING A FINAL NEGATIVE REPORT     Performed at [facility]   Report Status PENDING   Incomplete  CULTURE, BLOOD (ROUTINE X 2)     Status: None   Collection Time    08/02/14  2:20 PM      Result Value Ref Range Status   Specimen Description BLOOD HAND LEFT   Final   Special Requests BOTTLES DRAWN AEROBIC ONLY 3CC   Final   Culture  Setup Time     Final   Value: 08/02/2014 20:05     Performed at [facility]   Culture     Final   Value:        BLOOD CULTURE RECEIVED NO GROWTH TO DATE CULTURE WILL BE HELD FOR 5 DAYS BEFORE ISSUING A FINAL NEGATIVE REPORT     Performed at [facility]   Report Status PENDING   Incomplete     Studies: Dg Chest 2 View  07/28/2014   CLINICAL DATA:  Failure to thrive, decreased oral intake, history of dementia  EXAM: CHEST  2 VIEW  COMPARISON:  Chest x-ray from right rib series and July 03, 2014  FINDINGS: The lungs are adequately inflated and clear. The heart is  top-normal in size. The pulmonary vascularity is not engorged. There is tortuosity of the descending thoracic aorta. There is no pleural effusion. There is mild stable curvature of the thoracolumbar spine.  IMPRESSION: There is no active cardiopulmonary disease.   Electronically Signed   By: David  [place]   On: 07/28/2014 17:16   Dg Chest Port 1 View  08/02/2014   CLINICAL DATA:  80 year old female with fever. Initial encounter.  EXAM: PORTABLE CHEST - 1 VIEW  COMPARISON:  07/28/2014 and earlier.  FINDINGS: Portable AP semi upright view at 1258 hrs. Mildly lower lung volumes. Stable cardiomegaly and mediastinal contours. Visualized tracheal air column is within normal limits. Allowing for portable technique, the lungs are clear. No pneumothorax or pleural effusion identified.  IMPRESSION: No acute cardiopulmonary abnormality.   Electronically Signed   By: Augusto Gamble M.D.   On: 08/02/2014 13:08    Scheduled Meds: . cholecalciferol  1,000 Units Oral Daily  . feeding supplement (ENSURE COMPLETE)  237 mL Oral TID WC  . ferrous sulfate  325 mg Oral Q breakfast  . fluticasone  2 spray Each Nare BID  . folic acid  1 mg Oral Daily  . heparin  5,000 Units Subcutaneous 3 times per day  . latanoprost  1 drop Both Eyes QHS  . metronidazole  500  mg Intravenous Q8H  . multivitamin with minerals  1 tablet Oral Daily  . mupirocin ointment  1 application Nasal BID  . pantoprazole  40 mg Oral Daily  . pneumococcal 23 valent vaccine  0.5 mL Intramuscular Tomorrow-1000  . simvastatin  5 mg Oral QHS  . sodium chloride  3 mL Intravenous Q12H  . thiamine  100 mg Oral Daily   Continuous Infusions: . dextrose 5 % and 0.45 % NaCl with KCl 20 mEq/L 125 mL/hr (08/02/14 2259)    Principal Problem:   ARF (acute renal failure) Active Problems:   Dehydration   Essential hypertension, benign   Renal failure   Protein-calorie malnutrition, severe    Time spent: 40 minutes   [facility]  [facility] Pager [phone]. If 7PM-7AM, please contact night-coverage at [email or web address], password [facility] 08/03/2014, 3:19 PM  LOS: 6 days

## 2014-08-04 DIAGNOSIS — N179 Acute kidney failure, unspecified: Principal | ICD-10-CM

## 2014-08-04 DIAGNOSIS — I10 Essential (primary) hypertension: Secondary | ICD-10-CM

## 2014-08-04 DIAGNOSIS — E43 Unspecified severe protein-calorie malnutrition: Secondary | ICD-10-CM

## 2014-08-04 MED ORDER — ENSURE COMPLETE PO LIQD: 237.0000 mL | Freq: Three times a day (TID) | ORAL | Status: AC

## 2014-08-04 MED ORDER — MORPHINE SULFATE (CONCENTRATE) 10 MG /0.5 ML PO SOLN: 5.0000 mg | ORAL | Status: AC | PRN

## 2014-08-04 MED ORDER — LORAZEPAM 2 MG/ML PO CONC: 1.0000 mg | ORAL | Status: AC | PRN

## 2014-08-04 NOTE — Progress Notes (Signed)
Chaplain responded to spiritual care consult from palliative care team. No sitter was present. (Per nurse, sitter has been discontinued.) Pt was curled up in bed with eyes closed. Breakfast was still on tray, untouched. I spoke to pt several times and she did not open her eyes. But on the chance that she could hear me, I spoke words of encouragement and offered a prayer.

## 2014-08-04 NOTE — Discharge Summary (Signed)
Physician Discharge Summary  Karla Morgan ZOX:[id number] DOB: [birth year] DOA: 07/28/2014  PCP: Karla Jenny, MD  Admit date: 07/28/2014 Discharge date: 08/06/2014  Time spent: 45 minutes  Recommendations for Outpatient Follow-up:  Patient will be discharged to [facility].  Patient was made comfort care.  She should continue medications as needed for comfort.  Discharge Diagnoses:  Principal Problem:   ARF (acute renal failure) Active Problems:   Dehydration   Essential hypertension, benign   Renal failure   Protein-calorie malnutrition, severe   Anemia   Fever   C. difficile colitis  Discharge Condition: Stable  Diet recommendation: Dysphagia 1  Filed Weights   07/31/14 0519 08/01/14 0545 08/02/14 0500  Weight: 43.999 kg (97 lb) 44.906 kg (99 lb) 49.896 kg (110 lb)    History of present illness:  On 07/28/2014 Karla Morgan is a 80 y.o. female with baseline history of dementia presented as a transfer from [facility] for dehydration. Patient presented there originally with flank pain. It was felt that she had a UTI and was constipated. Patient had nonspecific gas pattern on the abdominal series and had a negative CXR noted. She did have blood work done and which showed that she had significant hypernatremia and also had worsening of her creatinine. Patient had a urinary evaluation done and was felt to be negative for a UTI. She had no nausea, no vomiting, no chest pain noted. She did complain of some non-specific abdominal pain however. There were no falls and no recent stroke.   Hospital Course:  Palliative care was consulted and contacted Karla Morgan, patient's guardian. Patient was made comfort care, her CODE STATUS was also changed to DO NOT RESUSCITATE. Her antibiotics were discontinued, and patient was given comfort feeding.  During the patient's hospitalization, she was found to have C. difficile colitis as well as acute renal failure.  C. difficile colitis -Patient  was placed on enteric percussions and was started on IV Flagyl however this was discontinued as patient was made comfort care.  Acute renal failure -Likely prerenal and related to dehydration in the setting of C. difficile colitis -Her creatinine did improve with some IV hydration however her labs are no longer being monitored  Dehydration -Likely secondary to C. difficile colitis as well as poor oral intake  -Patient was placed on IV fluids  Essential hypertension -patient currently hypotensive  Hypernatremia/hypokalemia/hypoglycemic -Secondary to poor oral intake as well as diarrhea -Labs no longer being monitored due to to comfort measures  Anemia -Patient did receive a transfusion during her hospital course, however could not tolerate the transfusion and suspected fever. -She was given another unit with premedication of Tylenol and Benadryl -FOBT was positive -No further lab draws due to comfort care  Fever -Resolved -Possibly secondary to C. difficile colitis versus febrile transfusion  Procedures: None  Consultations: Palliative care  Discharge Exam: Filed Vitals:   08/06/14 0610  BP: 117/49  Pulse: 88  Temp:   Resp: 16     General: Well developed, malnourished, NAD, appears stated age  HEENT: NCAT, mucous membranes moist.  Cardiovascular: S1 S2 auscultated, Regular rate and rhythm.  Respiratory: Clear to auscultation bilaterally with equal chest rise  Abdomen: Soft, nontender, nondistended, + bowel sounds  Extremities: warm dry without cyanosis clubbing or edema  Discharge Instructions      Discharge Instructions   Discharge instructions    Complete by:  As directed   Patient will be discharged to [facility].  Patient was made comfort care.  She should continue medications as needed for comfort.            Medication List    STOP taking these medications       cholecalciferol 1000 UNITS tablet  Commonly known as:  VITAMIN D      ciprofloxacin 500 MG tablet  Commonly known as:  CIPRO     ferrous sulfate 325 (65 FE) MG tablet     fluticasone 50 MCG/ACT nasal spray  Commonly known as:  FLONASE     latanoprost 0.005 % ophthalmic solution  Commonly known as:  XALATAN     omeprazole 20 MG capsule  Commonly known as:  PRILOSEC     ROBAFEN 100 MG/5ML syrup  Generic drug:  guaifenesin     simvastatin 5 MG tablet  Commonly known as:  ZOCOR     vitamin A & D ointment      TAKE these medications       acetaminophen 325 MG tablet  Commonly known as:  TYLENOL  Take 650 mg by mouth 2 (two) times daily.     feeding supplement (ENSURE COMPLETE) Liqd  Take 237 mLs by mouth 3 (three) times daily with meals.     LORazepam 2 MG/ML concentrated solution  Commonly known as:  ATIVAN  Take 0.5 mLs (1 mg total) by mouth every 4 (four) hours as needed for anxiety.     morphine CONCENTRATE 10 mg / 0.5 ml concentrated solution  Take 0.25 mLs (5 mg total) by mouth every 2 (two) hours as needed for severe pain or shortness of breath.     ondansetron 4 MG disintegrating tablet  Commonly known as:  ZOFRAN-ODT  Take 4 mg by mouth every 4 (four) hours as needed for nausea or vomiting.     polyethylene glycol packet  Commonly known as:  MIRALAX / GLYCOLAX  Take 17 g by mouth every morning.     senna 8.6 MG Tabs tablet  Commonly known as:  SENOKOT  Take 1 tablet by mouth at bedtime.       No Known Allergies    The results of significant diagnostics from this hospitalization (including imaging, microbiology, ancillary and laboratory) are listed below for reference.    Significant Diagnostic Studies: Dg Chest 2 View  07/28/2014   CLINICAL DATA:  Failure to thrive, decreased oral intake, history of dementia  EXAM: CHEST  2 VIEW  COMPARISON:  Chest x-ray from right rib series and July 03, 2014  FINDINGS: The lungs are adequately inflated and clear. The heart is top-normal in size. The pulmonary vascularity is not  engorged. There is tortuosity of the descending thoracic aorta. There is no pleural effusion. There is mild stable curvature of the thoracolumbar spine.  IMPRESSION: There is no active cardiopulmonary disease.   Electronically Signed   By: David  [place]   On: 07/28/2014 17:16   Dg Chest Port 1 View  08/02/2014   CLINICAL DATA:  80 year old female with fever. Initial encounter.  EXAM: PORTABLE CHEST - 1 VIEW  COMPARISON:  07/28/2014 and earlier.  FINDINGS: Portable AP semi upright view at 1258 hrs. Mildly lower lung volumes. Stable cardiomegaly and mediastinal contours. Visualized tracheal air column is within normal limits. Allowing for portable technique, the lungs are clear. No pneumothorax or pleural effusion identified.  IMPRESSION: No acute cardiopulmonary abnormality.   Electronically Signed   By: Augusto Gamble M.D.   On: 08/02/2014 13:08    Microbiology: Recent Results (from the past 240 hour(s))  URINE CULTURE     Status: None   Collection Time    07/28/14  4:36 PM      Result Value Ref Range Status   Specimen Description URINE, CATHETERIZED   Final   Special Requests NONE   Final   Culture  Setup Time     Final   Value: 07/29/2014 12:39     Performed at [facility]   Colony Count     Final   Value: NO GROWTH     Performed at [facility]   Culture     Final   Value: NO GROWTH     Performed at [facility]   Report Status 07/30/2014 FINAL   Final  MRSA PCR SCREENING     Status: Abnormal   Collection Time    07/29/14  6:58 AM      Result Value Ref Range Status   MRSA by PCR POSITIVE (*) NEGATIVE Final   Comment:            The GeneXpert MRSA Assay (FDA     approved for NASAL specimens     only), is one component of a     comprehensive MRSA colonization     surveillance program. It is not     intended to diagnose MRSA     infection nor to guide or     monitor treatment for     MRSA infections.     RESULT CALLED TO, READ BACK BY AND VERIFIED WITH:      BUTLER RN 8:20 07/29/14 ([email or web address])  CLOSTRIDIUM DIFFICILE BY PCR     Status: Abnormal   Collection Time    08/02/14  9:29 AM      Result Value Ref Range Status   C difficile by pcr POSITIVE (*) NEGATIVE Final   Comment: CRITICAL RESULT CALLED TO, READ BACK BY AND VERIFIED WITH:     SIFON RN 12:05 08/02/14 ([email or web address])  CULTURE, BLOOD (ROUTINE X 2)     Status: None   Collection Time    08/02/14  2:10 PM      Result Value Ref Range Status   Specimen Description BLOOD ARM LEFT   Final   Special Requests BOTTLES DRAWN AEROBIC ONLY 8CC   Final   Culture  Setup Time     Final   Value: 08/02/2014 20:06     Performed at [facility]   Culture     Final   Value:        BLOOD CULTURE RECEIVED NO GROWTH TO DATE CULTURE WILL BE HELD FOR 5 DAYS BEFORE ISSUING A FINAL NEGATIVE REPORT     Performed at [facility]   Report Status PENDING   Incomplete  CULTURE, BLOOD (ROUTINE X 2)     Status: None   Collection Time    08/02/14  2:20 PM      Result Value Ref Range Status   Specimen Description BLOOD HAND LEFT   Final   Special Requests BOTTLES DRAWN AEROBIC ONLY 3CC   Final   Culture  Setup Time     Final   Value: 08/02/2014 20:05     Performed at [facility]   Culture     Final   Value:        BLOOD CULTURE RECEIVED NO GROWTH TO DATE CULTURE WILL BE HELD FOR 5 DAYS BEFORE ISSUING A FINAL NEGATIVE REPORT     Performed at [facility]   Report  Status PENDING   Incomplete     Labs: Basic Metabolic Panel:  Recent Labs Lab 07/30/14 2141 08/01/14 0522 08/02/14 0530 08/03/14 0645  NA 164* 151* 145 141  K 4.1 3.1* 3.4* 3.7  CL >130* 122* 119* 114*  CO2 18*  GLUCOSE 112* 111* 119* 106*  BUN 45* 26* 20 13  CREATININE 1.10 1.07 1.06 0.91  CALCIUM 9.3 9.1 8.5 8.6   Liver Function Tests: No results found for this basename: AST, ALT, ALKPHOS, BILITOT, PROT, ALBUMIN,  in the last 168 hours No results found for this basename: LIPASE, AMYLASE,  in the last  168 hours No results found for this basename: AMMONIA,  in the last 168 hours CBC:  Recent Labs Lab 08/01/14 0522 08/02/14 0530 08/03/14 0645  WBC 4.6 4.9 3.8*  HGB 7.4* 9.6* 9.6*  HCT 25.0* 30.5* 30.3*  MCV 88.3 83.1 81.9  PLT 201 173 146*   Cardiac Enzymes: No results found for this basename: CKTOTAL, CKMB, CKMBINDEX, TROPONINI,  in the last 168 hours BNP: BNP (last 3 results) No results found for this basename: PROBNP,  in the last 8760 hours CBG:  Recent Labs Lab 08/01/14 0732 08/02/14 0808 08/03/14 0802 08/03/14 0849 08/03/14 0940  GLUCAP 89 90 46* 39* 72       Signed:  Burnice Oestreicher  [facility] 08/06/2014, 9:31 AM

## 2014-08-04 NOTE — Progress Notes (Signed)
Patient Karla Morgan      DOB: 02-Mar-1920      WJX:[id number]  Patient sleeping , noted able to return to [facility] with Hospice.  Will continue to shadow with you.   Chibueze Beasley L. Ladona Ridgel, MD MBA The Palliative Medicine Team at [facility] Phone: [phone] Pager: [phone] ( Use team phone after hours)

## 2014-08-04 NOTE — Progress Notes (Signed)
SLP Cancellation Note  Patient Details Name: HILARI WETHINGTON MRN: [id number] DOB: Mar 31, 1920   Cancelled treatment:       Reason Eval/Treat Not Completed: Other (comment). Per chart review, goals of care are now being transitioned to full comfort. SLP to sign off at this time, as recommendations for safest PO intake remain unchanged. Please re-order SLP if we can be of any further assistance. Thank you.    Maxcine Ham, M.A. CCC-SLP [phone]  Maxcine Ham 08/04/2014, 11:57 AM

## 2014-08-04 NOTE — Progress Notes (Signed)
[facility]                                                                              Patient Demographics  Karla Morgan, is a 80 y.o. female, DOB - February 17, 1920, ZOX:[id number]  Admit date - 07/28/2014   Admitting Physician Karla Pax, MD  Outpatient Primary MD for the patient is Karla Jenny, MD  LOS - 7   Chief Complaint  Patient presents with  . Failure To Thrive        Assessment & Plan   Palliative care was consulted and contacted Karla Morgan, patient's guardian. Patient was made comfort care, her CODE STATUS was also changed to DO NOT RESUSCITATE. Her antibiotics were discontinued, and patient was given comfort feeding.  During the patient's hospitalization, she was found to have C. difficile colitis as well as acute renal failure.   C. difficile colitis  -Patient was placed on enteric percussions and was started on IV Flagyl however this was discontinued as patient was made comfort care.   Acute renal failure  -Likely prerenal and related to dehydration in the setting of C. difficile colitis  -Her creatinine did improve with some IV hydration however her labs are no longer being monitored   Dehydration  -Likely secondary to C. difficile colitis as well as poor oral intake  -Patient was placed on IV fluids   Essential hypertension  -patient currently hypotensive   Hypernatremia/hypokalemia/hypoglycemic  -Secondary to poor oral intake as well as diarrhea  -Labs no longer being monitored due to to comfort measures   Anemia  -Patient did receive a transfusion during her hospital course, however could not tolerate the transfusion and suspected fever.  -She was given another unit with premedication of Tylenol and Benadryl  -FOBT was positive  -No further lab draws due to comfort care   Fever  -Resolved  Code Status: DNR  Family Communication: None at bedside  Disposition Plan: Admitted, pending placement.  Time Spent in minutes   30  minutes  Procedures  None  Consults  Palliative Care  DVT Prophylaxis  None  Lab Results  Component Value Date   PLT 146* 08/03/2014    Medications  Scheduled Meds: . feeding supplement (ENSURE COMPLETE)  237 mL Oral TID WC  . latanoprost  1 drop Both Eyes QHS   Continuous Infusions:  PRN Meds:.acetaminophen, acetaminophen, haloperidol, LORazepam, morphine CONCENTRATE, ondansetron (ZOFRAN) IV, ondansetron, ondansetron  Antibiotics    Anti-infectives   Start     Dose/Rate Route Frequency Ordered Stop   08/03/14 0030  metroNIDAZOLE (FLAGYL) IVPB 500 mg  Status:  Discontinued     500 mg 100 mL/hr over 60 Minutes Intravenous Every 8 hours 08/03/14 0016 08/03/14 1614   08/02/14 1415  metroNIDAZOLE (FLAGYL) tablet 500 mg  Status:  Discontinued     500 mg Oral 3 times per day 08/02/14 1400 08/03/14 0016        Subjective:   Karla Morgan seen and examined today.  Patient has no complaints.  Currently resting comfortable.  Very pleasant. Denies pain. Wishes to go back to sleep.  Objective:   Filed Vitals:   08/03/14 8119 08/03/14 2242 08/04/14 1478 08/04/14 1442  BP: 128/68 104/67 96/38 104/45  Pulse: 82  81 92  Temp: 98.8 F (37.1 C)  98.9 F (37.2 C) 98.7 F (37.1 C)  TempSrc: Axillary  Oral Axillary  Resp: Weight:      SpO2: 93% 94% 94% 100%    Wt Readings from Last 3 Encounters:  08/02/14 49.896 kg (110 lb)     Intake/Output Summary (Last 24 hours) at 08/04/14 1643 Last data filed at 08/04/14 1442  Gross per 24 hour  Intake     60 ml  Output      0 ml  Net     60 ml    Exam General: Well developed, malnourished, NAD, appears stated age  HEENT: NCAT, mucous membranes moist.  Neck: Supple, no JVD, no masses  Cardiovascular: S1 S2 auscultated, no rubs, murmurs or gallops. Regular rate and rhythm.  Respiratory: Clear to auscultation bilaterally with equal chest rise  Abdomen: Soft, nontender, nondistended, + bowel sounds  Extremities:  warm dry without cyanosis clubbing or edema  Data Review   Micro Results Recent Results (from the past 240 hour(s))  URINE CULTURE     Status: None   Collection Time    07/28/14  4:36 PM      Result Value Ref Range Status   Specimen Description URINE, CATHETERIZED   Final   Special Requests NONE   Final   Culture  Setup Time     Final   Value: 07/29/2014 12:39     Performed at [facility] Count     Final   Value: NO GROWTH     Performed at [facility]   Culture     Final   Value: NO GROWTH     Performed at [facility]   Report Status 07/30/2014 FINAL   Final  MRSA PCR SCREENING     Status: Abnormal   Collection Time    07/29/14  6:58 AM      Result Value Ref Range Status   MRSA by PCR POSITIVE (*) NEGATIVE Final   Comment:            The GeneXpert MRSA Assay (FDA     approved for NASAL specimens     only), is one component of a     comprehensive MRSA colonization     surveillance program. It is not     intended to diagnose MRSA     infection nor to guide or     monitor treatment for     MRSA infections.     RESULT CALLED TO, READ BACK BY AND VERIFIED WITH:     BUTLER RN 8:20 07/29/14 (wilsonm)  CLOSTRIDIUM DIFFICILE BY PCR     Status: Abnormal   Collection Time    08/02/14  9:29 AM      Result Value Ref Range Status   C difficile by pcr POSITIVE (*) NEGATIVE Final   Comment: CRITICAL RESULT CALLED TO, READ BACK BY AND VERIFIED WITH:     SIFON RN 12:05 08/02/14 (wilsonm)  CULTURE, BLOOD (ROUTINE X 2)     Status: None   Collection Time    08/02/14  2:10 PM      Result Value Ref Range Status   Specimen Description BLOOD ARM LEFT   Final   Special Requests BOTTLES DRAWN AEROBIC ONLY 8CC   Final   Culture  Setup Time     Final   Value: 08/02/2014 20:06  Performed at [facility]     Final   Value:        BLOOD CULTURE RECEIVED NO GROWTH TO DATE CULTURE WILL BE HELD FOR 5 DAYS BEFORE ISSUING A FINAL NEGATIVE  REPORT     Performed at [facility]   Report Status PENDING   Incomplete  CULTURE, BLOOD (ROUTINE X 2)     Status: None   Collection Time    08/02/14  2:20 PM      Result Value Ref Range Status   Specimen Description BLOOD HAND LEFT   Final   Special Requests BOTTLES DRAWN AEROBIC ONLY 3CC   Final   Culture  Setup Time     Final   Value: 08/02/2014 20:05     Performed at [facility]   Culture     Final   Value:        BLOOD CULTURE RECEIVED NO GROWTH TO DATE CULTURE WILL BE HELD FOR 5 DAYS BEFORE ISSUING A FINAL NEGATIVE REPORT     Performed at [facility]   Report Status PENDING   Incomplete    Radiology Reports Dg Chest 2 View  07/28/2014   CLINICAL DATA:  Failure to thrive, decreased oral intake, history of dementia  EXAM: CHEST  2 VIEW  COMPARISON:  Chest x-ray from right rib series and July 03, 2014  FINDINGS: The lungs are adequately inflated and clear. The heart is top-normal in size. The pulmonary vascularity is not engorged. There is tortuosity of the descending thoracic aorta. There is no pleural effusion. There is mild stable curvature of the thoracolumbar spine.  IMPRESSION: There is no active cardiopulmonary disease.   Electronically Signed   By: David  [place]   On: 07/28/2014 17:16   Dg Chest Port 1 View  08/02/2014   CLINICAL DATA:  80 year old female with fever. Initial encounter.  EXAM: PORTABLE CHEST - 1 VIEW  COMPARISON:  07/28/2014 and earlier.  FINDINGS: Portable AP semi upright view at 1258 hrs. Mildly lower lung volumes. Stable cardiomegaly and mediastinal contours. Visualized tracheal air column is within normal limits. Allowing for portable technique, the lungs are clear. No pneumothorax or pleural effusion identified.  IMPRESSION: No acute cardiopulmonary abnormality.   Electronically Signed   By: Augusto Gamble M.D.   On: 08/02/2014 13:08    CBC  Recent Labs Lab 07/29/14 0439 07/30/14 0420 08/01/14 0522 08/02/14 0530 08/03/14 0645   WBC 4.0 3.3* 4.6 4.9 3.8*  HGB 7.8* 8.3* 7.4* 9.6* 9.6*  HCT 27.4* 29.1* 25.0* 30.5* 30.3*  PLT 315 302 201 173 146*  MCV 87.8 88.7 88.3 83.1 81.9  MCH 25.0* 25.3* 26.1 26.2 25.9*  MCHC 28.5* 28.5* 29.6* 31.5 31.7  RDW 21.4* 21.8* 21.2* 20.5* 20.2*    Chemistries   Recent Labs Lab 07/29/14 0439 07/30/14 0828 07/30/14 2141 08/01/14 0522 08/02/14 0530 08/03/14 0645  NA 160* 163* 164* 151* 145 141  K 4.3 4.6 4.1 3.1* 3.4* 3.7  CL 129* >130* >130* 122* 119* 114*  CO2 18*  GLUCOSE 91 96 112* 111* 119* 106*  BUN 84* 53* 45* 26* 20 13  CREATININE 1.66* 1.22* 1.10 1.07 1.06 0.91  CALCIUM 9.8 9.3 9.3 9.1 8.5 8.6  AST 35 40*  --   --   --   --   ALT 20 19  --   --   --   --   ALKPHOS 76 69  --   --   --   --  BILITOT 0.5 0.4  --   --   --   --    ------------------------------------------------------------------------------------------------------------------ CrCl is unknown because there is no height on file for the current visit. ------------------------------------------------------------------------------------------------------------------ No results found for this basename: HGBA1C,  in the last 72 hours ------------------------------------------------------------------------------------------------------------------ No results found for this basename: CHOL, HDL, LDLCALC, TRIG, CHOLHDL, LDLDIRECT,  in the last 72 hours ------------------------------------------------------------------------------------------------------------------ No results found for this basename: TSH, T4TOTAL, FREET3, T3FREE, THYROIDAB,  in the last 72 hours ------------------------------------------------------------------------------------------------------------------ No results found for this basename: VITAMINB12, FOLATE, FERRITIN, TIBC, IRON, RETICCTPCT,  in the last 72 hours  Coagulation profile No results found for this basename: INR, PROTIME,  in the last 168 hours  No results  found for this basename: DDIMER,  in the last 72 hours  Cardiac Enzymes No results found for this basename: CK, CKMB, TROPONINI, MYOGLOBIN,  in the last 168 hours ------------------------------------------------------------------------------------------------------------------ No components found with this basename: POCBNP,     Tayron Hunnell D.O. on 08/04/2014 at 4:43 PM  Between 7am to 7pm - Pager - [phone]  After 7pm go to [email or web address] - password [email or web address]  And look for the night coverage person covering for me after hours  [facility] Office  [phone]

## 2014-08-04 NOTE — Clinical Social Work Note (Signed)
Covering CSW received notification pt to be discharged back to [facility] with hospice services following. CSW spoke with facility Health and Wellness Director, who informed CSW pt was unable to return due to pt at a higher level of care than provided at facility. CSW updated pt's DSS Garner Gavel ([phone]). Pt's Guardian stated she was agreeable to residential hospice placement in [facility]. CSW has made appropriate referrals to residential hospice placement. Pt to be evaluated/assesed by residential hospice facilities on 08/05/2014.   MD and RNCM updated regarding information above. CSW to continue to follow and assist with discharge planning needs.  Marcelline Deist, MSW, [facility] Licensed Clinical Social Worker [phone] and [phone]

## 2014-08-04 NOTE — Progress Notes (Signed)
CSW spoke with [facility] representatives on yesterday evening in regards to accepting Ms. Karla Morgan back with Hospice services. They were agreeable. A subsequent call was made to the Guardian Crist Fat about sending Ms. Karla Morgan back to the assisted living. She was agreeable to this plan. CSW notified attending physician and treatment team this morning.   Gretta Cool, LCSW Assistant Director Clinical Social Work [phone]

## 2014-08-05 ENCOUNTER — Encounter (HOSPITAL_COMMUNITY): Payer: Self-pay | Admitting: *Deleted

## 2014-08-05 NOTE — Clinical Social Work Note (Signed)
CSW informed pt offered bed at [facility] residential hospice, per admissions liaison. CSW made multiple attempts to contact pt's legal guardian, Crist Fat, regarding completing admissions paperwork (documentation required prior to pt admission to [facility]). Per admissions liaison, pt's legal guardian has scheduled time (9am on 08/06/2014) to complete admission paperwork.   CSW to continue to follow and assist with discharge to [facility] on 08/06/2014.  Marcelline Deist, MSW, [facility] Licensed Clinical Social Worker [phone] and [phone]

## 2014-08-05 NOTE — Consult Note (Signed)
[facility] Liaison: [place] room available for patient tomorrow 8/28 (earlier in day room was available today). Have left multiple messages at both contact numbers for Delle Reining with no response. CSW and RNCM aware. Met with guardian of the estate Mariann Laster [place] at her office to complete financial. Mariann Laster deferred consent to care and election of hospice medicare to [place]. Mariann Laster confirmed contact numbers for Hassan Rowan and also left voice message requesting call Await call back from [place] at this time. Will update CSW if Hassan Rowan returns call. Thank you. Erling Conte LCSW [phone]

## 2014-08-05 NOTE — Progress Notes (Signed)
[facility]                                                                              Patient Demographics  Karla Morgan, is a 80 y.o. female, DOB - [birth year], AVW:[id number]  Admit date - 07/28/2014   Admitting Physician Yevonne Pax, MD  Outpatient Primary MD for the patient is Florentina Jenny, MD  LOS - 8   Chief Complaint  Patient presents with  . Failure To Thrive        Assessment & Plan   Palliative care was consulted and contacted Karla Morgan, patient's guardian. Patient was made comfort care, her CODE STATUS was also changed to DO NOT RESUSCITATE. Her antibiotics were discontinued, and patient was given comfort feeding.  During the patient's hospitalization, she was found to have C. difficile colitis as well as acute renal failure.   C. difficile colitis  -Patient was placed on enteric percussions and was started on IV Flagyl however this was discontinued as patient was made comfort care.   Acute renal failure  -Likely prerenal and related to dehydration in the setting of C. difficile colitis  -Her creatinine did improve with some IV hydration however her labs are no longer being monitored   Dehydration  -Likely secondary to C. difficile colitis as well as poor oral intake  -Patient was placed on IV fluids   Essential hypertension  -patient currently hypotensive   Hypernatremia/hypokalemia/hypoglycemic  -Secondary to poor oral intake as well as diarrhea  -Labs no longer being monitored due to to comfort measures   Anemia  -Patient did receive a transfusion during her hospital course, however could not tolerate the transfusion and suspected fever.  -She was given another unit with premedication of Tylenol and Benadryl  -FOBT was positive  -No further lab draws due to comfort care   Fever  -Resolved  Code Status: DNR  Family Communication: None at bedside  Disposition Plan: Admitted, pending placement.  Time Spent in minutes   20  minutes  Procedures  None  Consults  Palliative Care  DVT Prophylaxis  None  Lab Results  Component Value Date   PLT 146* 08/03/2014    Medications  Scheduled Meds: . feeding supplement (ENSURE COMPLETE)  237 mL Oral TID WC  . latanoprost  1 drop Both Eyes QHS   Continuous Infusions:  PRN Meds:.acetaminophen, acetaminophen, haloperidol, LORazepam, morphine CONCENTRATE, ondansetron (ZOFRAN) IV, ondansetron, ondansetron  Antibiotics    Anti-infectives   Start     Dose/Rate Route Frequency Ordered Stop   08/03/14 0030  metroNIDAZOLE (FLAGYL) IVPB 500 mg  Status:  Discontinued     500 mg 100 mL/hr over 60 Minutes Intravenous Every 8 hours 08/03/14 0016 08/03/14 1614   08/02/14 1415  metroNIDAZOLE (FLAGYL) tablet 500 mg  Status:  Discontinued     500 mg Oral 3 times per day 08/02/14 1400 08/03/14 0016        Subjective:   Karla Morgan seen and examined today.  Patient has no complaints.  Currently resting comfortable.  Very pleasant. Denies chest pain, SOB, abdominal pain, or headache.  Objective:   Filed Vitals:   08/04/14 8295 08/04/14 1442 08/04/14 2202 08/05/14 6213  BP: 96/38 104/45 104/40 115/43  Pulse: 81 92 81 86  Temp: 98.9 F (37.2 C) 98.7 F (37.1 C)  98.1 F (36.7 C)  TempSrc: Oral Axillary  Axillary  Resp: Weight:      SpO2: 94% 100% 98% 98%    Wt Readings from Last 3 Encounters:  08/02/14 49.896 kg (110 lb)     Intake/Output Summary (Last 24 hours) at 08/05/14 1114 Last data filed at 08/04/14 2000  Gross per 24 hour  Intake    118 ml  Output      0 ml  Net    118 ml    Exam General: Well developed, malnourished, NAD, appears stated age  HEENT: NCAT, mucous membranes moist.  Cardiovascular: S1 S2 auscultated, no rubs, murmurs or gallops. Regular rate and rhythm.  Respiratory: Clear to auscultation bilaterally with equal chest rise  Abdomen: Soft, nontender, nondistended, + bowel sounds  Extremities: warm dry without  cyanosis clubbing or edema  Data Review   Micro Results Recent Results (from the past 240 hour(s))  URINE CULTURE     Status: None   Collection Time    07/28/14  4:36 PM      Result Value Ref Range Status   Specimen Description URINE, CATHETERIZED   Final   Special Requests NONE   Final   Culture  Setup Time     Final   Value: 07/29/2014 12:39     Performed at [facility] Count     Final   Value: NO GROWTH     Performed at [facility]   Culture     Final   Value: NO GROWTH     Performed at [facility]   Report Status 07/30/2014 FINAL   Final  MRSA PCR SCREENING     Status: Abnormal   Collection Time    07/29/14  6:58 AM      Result Value Ref Range Status   MRSA by PCR POSITIVE (*) NEGATIVE Final   Comment:            The GeneXpert MRSA Assay (FDA     approved for NASAL specimens     only), is one component of a     comprehensive MRSA colonization     surveillance program. It is not     intended to diagnose MRSA     infection nor to guide or     monitor treatment for     MRSA infections.     RESULT CALLED TO, READ BACK BY AND VERIFIED WITH:     BUTLER RN 8:20 07/29/14 ([email or web address])  CLOSTRIDIUM DIFFICILE BY PCR     Status: Abnormal   Collection Time    08/02/14  9:29 AM      Result Value Ref Range Status   C difficile by pcr POSITIVE (*) NEGATIVE Final   Comment: CRITICAL RESULT CALLED TO, READ BACK BY AND VERIFIED WITH:     SIFON RN 12:05 08/02/14 ([email or web address])  CULTURE, BLOOD (ROUTINE X 2)     Status: None   Collection Time    08/02/14  2:10 PM      Result Value Ref Range Status   Specimen Description BLOOD ARM LEFT   Final   Special Requests BOTTLES DRAWN AEROBIC ONLY 8CC   Final   Culture  Setup Time     Final   Value: 08/02/2014 20:06     Performed at [facility]  Culture     Final   Value:        BLOOD CULTURE RECEIVED NO GROWTH TO DATE CULTURE WILL BE HELD FOR 5 DAYS BEFORE ISSUING A FINAL NEGATIVE REPORT     Performed  at [facility]   Report Status PENDING   Incomplete  CULTURE, BLOOD (ROUTINE X 2)     Status: None   Collection Time    08/02/14  2:20 PM      Result Value Ref Range Status   Specimen Description BLOOD HAND LEFT   Final   Special Requests BOTTLES DRAWN AEROBIC ONLY 3CC   Final   Culture  Setup Time     Final   Value: 08/02/2014 20:05     Performed at [facility]   Culture     Final   Value:        BLOOD CULTURE RECEIVED NO GROWTH TO DATE CULTURE WILL BE HELD FOR 5 DAYS BEFORE ISSUING A FINAL NEGATIVE REPORT     Performed at [facility]   Report Status PENDING   Incomplete    Radiology Reports Dg Chest 2 View  07/28/2014   CLINICAL DATA:  Failure to thrive, decreased oral intake, history of dementia  EXAM: CHEST  2 VIEW  COMPARISON:  Chest x-ray from right rib series and July 03, 2014  FINDINGS: The lungs are adequately inflated and clear. The heart is top-normal in size. The pulmonary vascularity is not engorged. There is tortuosity of the descending thoracic aorta. There is no pleural effusion. There is mild stable curvature of the thoracolumbar spine.  IMPRESSION: There is no active cardiopulmonary disease.   Electronically Signed   By: David  [place]   On: 07/28/2014 17:16   Dg Chest Port 1 View  08/02/2014   CLINICAL DATA:  80 year old female with fever. Initial encounter.  EXAM: PORTABLE CHEST - 1 VIEW  COMPARISON:  07/28/2014 and earlier.  FINDINGS: Portable AP semi upright view at 1258 hrs. Mildly lower lung volumes. Stable cardiomegaly and mediastinal contours. Visualized tracheal air column is within normal limits. Allowing for portable technique, the lungs are clear. No pneumothorax or pleural effusion identified.  IMPRESSION: No acute cardiopulmonary abnormality.   Electronically Signed   By: Augusto Gamble M.D.   On: 08/02/2014 13:08    CBC  Recent Labs Lab 07/30/14 0420 08/01/14 0522 08/02/14 0530 08/03/14 0645  WBC 3.3* 4.6 4.9 3.8*  HGB 8.3* 7.4*  9.6* 9.6*  HCT 29.1* 25.0* 30.5* 30.3*  PLT 302 201 173 146*  MCV 88.7 88.3 83.1 81.9  MCH 25.3* 26.1 26.2 25.9*  MCHC 28.5* 29.6* 31.5 31.7  RDW 21.8* 21.2* 20.5* 20.2*    Chemistries   Recent Labs Lab 07/30/14 0828 07/30/14 2141 08/01/14 0522 08/02/14 0530 08/03/14 0645  NA 163* 164* 151* 145 141  K 4.6 4.1 3.1* 3.4* 3.7  CL >130* >130* 122* 119* 114*  CO2 18*  GLUCOSE 96 112* 111* 119* 106*  BUN 53* 45* 26* 20 13  CREATININE 1.22* 1.10 1.07 1.06 0.91  CALCIUM 9.3 9.3 9.1 8.5 8.6  AST 40*  --   --   --   --   ALT 19  --   --   --   --   ALKPHOS 69  --   --   --   --   BILITOT 0.4  --   --   --   --    ------------------------------------------------------------------------------------------------------------------ CrCl is  unknown because there is no height on file for the current visit. ------------------------------------------------------------------------------------------------------------------ No results found for this basename: HGBA1C,  in the last 72 hours ------------------------------------------------------------------------------------------------------------------ No results found for this basename: CHOL, HDL, LDLCALC, TRIG, CHOLHDL, LDLDIRECT,  in the last 72 hours ------------------------------------------------------------------------------------------------------------------ No results found for this basename: TSH, T4TOTAL, FREET3, T3FREE, THYROIDAB,  in the last 72 hours ------------------------------------------------------------------------------------------------------------------ No results found for this basename: VITAMINB12, FOLATE, FERRITIN, TIBC, IRON, RETICCTPCT,  in the last 72 hours  Coagulation profile No results found for this basename: INR, PROTIME,  in the last 168 hours  No results found for this basename: DDIMER,  in the last 72 hours  Cardiac Enzymes No results found for this basename: CK, CKMB, TROPONINI, MYOGLOBIN,   in the last 168 hours ------------------------------------------------------------------------------------------------------------------ No components found with this basename: POCBNP,     Evelio Rueda D.O. on 08/05/2014 at 11:14 AM  Between 7am to 7pm - Pager - [phone]  After 7pm go to [email or web address] - password [email or web address]  And look for the night coverage person covering for me after hours  [facility] Office  [phone]

## 2014-08-06 NOTE — Consult Note (Signed)
[facility] Liaison: [facility] room available for patient today 08/06/14. Meeting with guardian Crist Fat this morning at 9 am to complete registration paper work. Please fax discharge summary to [phone] and RN please call report to [phone]. Please arrange transport for patient to arrive before noon. Thank you. Forrestine Him LCSW [phone]

## 2014-08-06 NOTE — Progress Notes (Signed)
Call report on pt to Garth Bigness at [facility].

## 2014-08-06 NOTE — Clinical Social Work Note (Signed)
CSW has faxed discharge summary to [facility]. CSW completed discharge packet and placed on pt's shadow chart. Pt's legal guardian, Crist Fat, completed admission paperwork with [facility] admissions liaison this (8/28) morning and updated regarding discharge. EMS [facility]) transportation has been arranged.  Marcelline Deist, MSW, [facility] Licensed Clinical Social Worker [phone] and [phone]

## 2014-08-08 LAB — CULTURE, BLOOD (ROUTINE X 2)
Culture: NO GROWTH
Culture: NO GROWTH

## 2014-08-09 LAB — TRANSFUSION REACTION
DAT C3: NEGATIVE
Post RXN DAT IgG: NEGATIVE

## 2014-09-09 DEATH — deceased

## 2015-11-17 IMAGING — CR DG CHEST 2V
1 series · 1 of 1 positions shown · non-contrast
Comparison: Chest x-ray from right rib series and July 03, 2014

CLINICAL DATA: Failure to thrive, decreased oral intake, history of
dementia

EXAM:
CHEST  2 VIEW

[view not recorded]
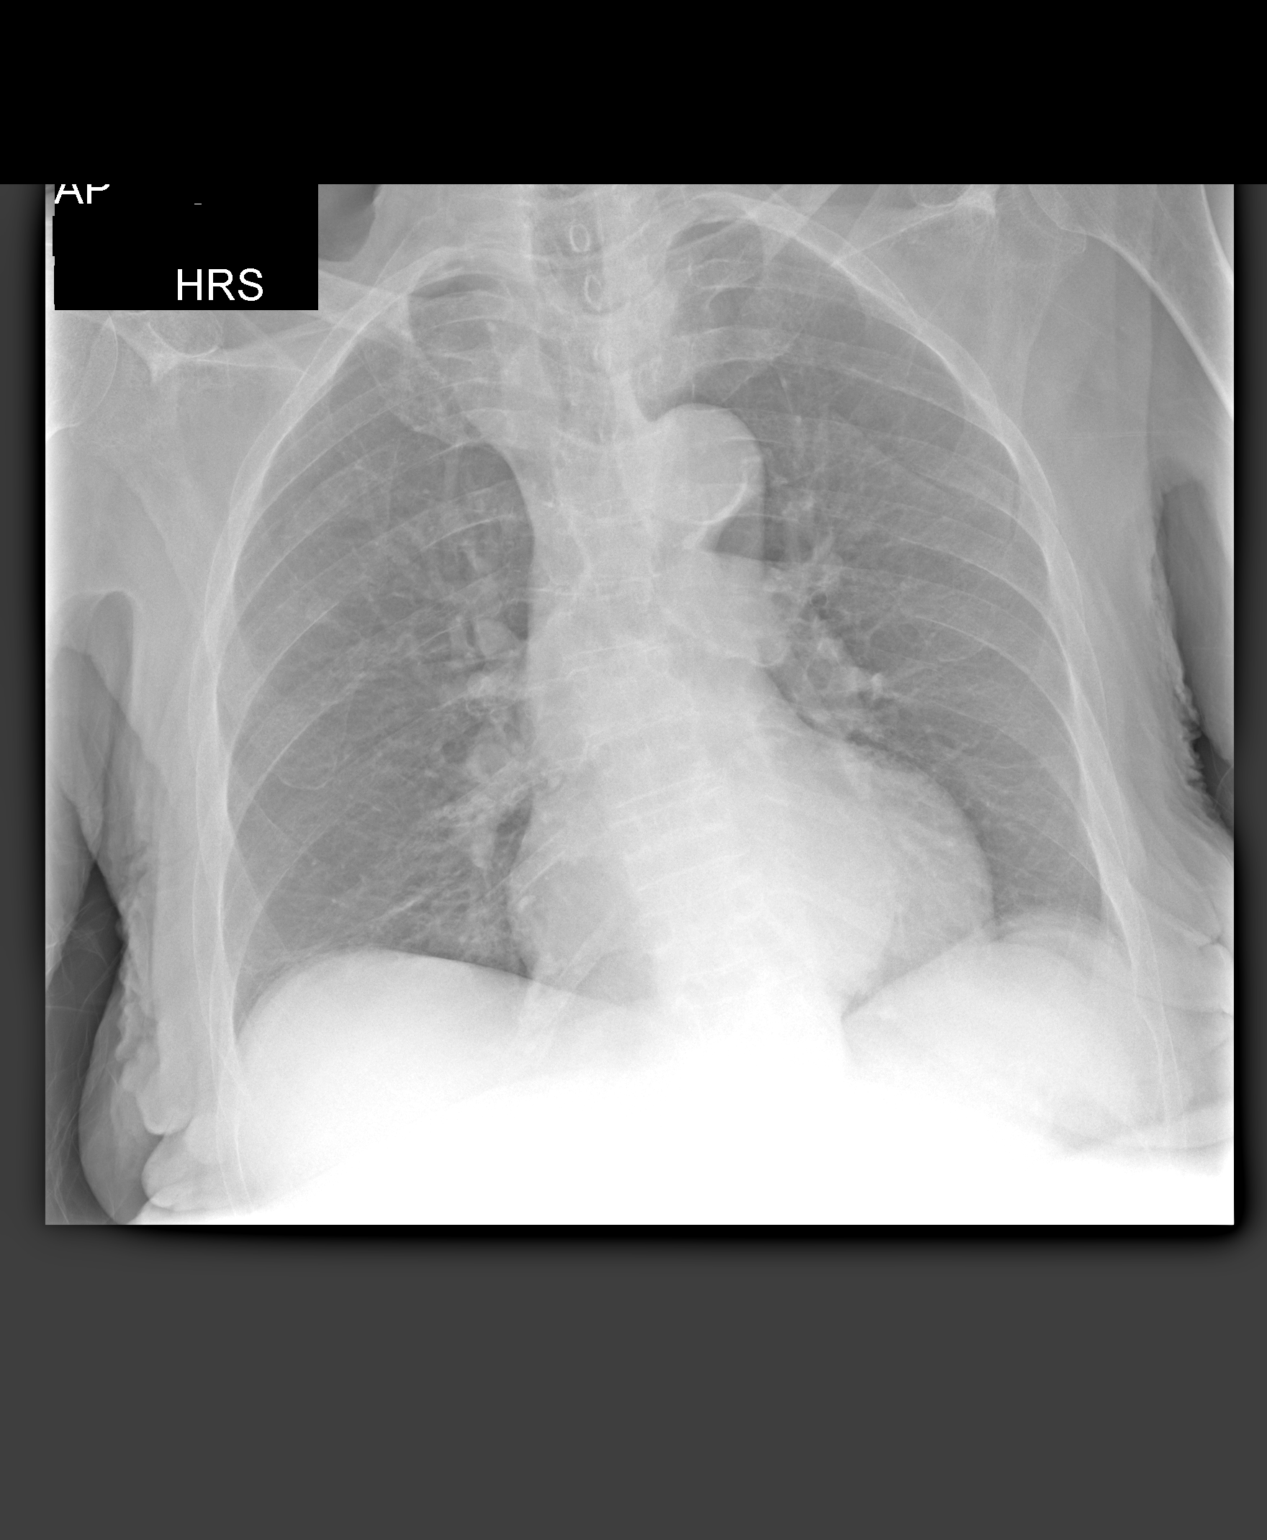

[1 of 1 positions shown; findings below may reference images not displayed]

FINDINGS: The lungs are adequately inflated and clear. The heart is top-normal
in size. The pulmonary vascularity is not engorged. There is
tortuosity of the descending thoracic aorta. There is no pleural
effusion. There is mild stable curvature of the thoracolumbar spine.
IMPRESSION: There is no active cardiopulmonary disease.
